# Patient Record
Sex: Male | Born: 1960
Health system: Southern US, Community
[De-identification: ages and names within clinical notes are randomized; demographics above are authoritative.]

---

## 2017-03-07 ENCOUNTER — Emergency Department (HOSPITAL_COMMUNITY)
Admission: EM | Admit: 2017-03-07 | Discharge: 2017-03-07 | Disposition: A | Discharge status: Home / Self Care | Payer: BLUE CROSS/BLUE SHIELD | Attending: Emergency Medicine | Admitting: Emergency Medicine

## 2017-03-07 ENCOUNTER — Emergency Department (HOSPITAL_COMMUNITY): Payer: BLUE CROSS/BLUE SHIELD

## 2017-03-07 DIAGNOSIS — R29810 Facial weakness: Secondary | ICD-10-CM | POA: Diagnosis present

## 2017-03-07 DIAGNOSIS — G510 Bell's palsy: Secondary | ICD-10-CM | POA: Diagnosis not present

## 2017-03-07 LAB — CBC WITH DIFFERENTIAL/PLATELET
BASOS ABS: 0 10*3/uL (ref 0.0–0.1)
BASOS PCT: 0 %
Eosinophils Absolute: 0.1 10*3/uL (ref 0.0–0.7)
Eosinophils Relative: 2 %
HEMATOCRIT: 42.6 % (ref 39.0–52.0)
HEMOGLOBIN: 14.7 g/dL (ref 13.0–17.0)
LYMPHS PCT: 36 %
Lymphs Abs: 2.2 10*3/uL (ref 0.7–4.0)
MCH: 30.7 pg (ref 26.0–34.0)
MCHC: 34.5 g/dL (ref 30.0–36.0)
MCV: 88.9 fL (ref 78.0–100.0)
MONOS PCT: 11 %
Monocytes Absolute: 0.6 10*3/uL (ref 0.1–1.0)
Neutro Abs: 3.2 10*3/uL (ref 1.7–7.7)
Neutrophils Relative %: 51 %
Platelets: 241 10*3/uL (ref 150–400)
RBC: 4.79 MIL/uL (ref 4.22–5.81)
RDW: 12.5 % (ref 11.5–15.5)
WBC: 6.1 10*3/uL (ref 4.0–10.5)

## 2017-03-07 LAB — URINALYSIS, ROUTINE W REFLEX MICROSCOPIC
Bilirubin Urine: NEGATIVE
Glucose, UA: NEGATIVE mg/dL
Hgb urine dipstick: NEGATIVE
Ketones, ur: NEGATIVE mg/dL
LEUKOCYTES UA: NEGATIVE
NITRITE: NEGATIVE
PH: 7 (ref 5.0–8.0)
Protein, ur: NEGATIVE mg/dL
SPECIFIC GRAVITY, URINE: 1.017 (ref 1.005–1.030)

## 2017-03-07 LAB — COMPREHENSIVE METABOLIC PANEL
ALBUMIN: 4.1 g/dL (ref 3.5–5.0)
ALK PHOS: 42 U/L (ref 38–126)
ALT: 41 U/L (ref 17–63)
AST: 27 U/L (ref 15–41)
Anion gap: 10 (ref 5–15)
BUN: 13 mg/dL (ref 6–20)
CHLORIDE: 103 mmol/L (ref 101–111)
CO2: 24 mmol/L (ref 22–32)
CREATININE: 0.8 mg/dL (ref 0.61–1.24)
Calcium: 8.8 mg/dL — ABNORMAL LOW (ref 8.9–10.3)
GFR calc non Af Amer: >60 mL/min (ref >60)
GLUCOSE: 98 mg/dL (ref 65–99)
Potassium: 4 mmol/L (ref 3.5–5.1)
SODIUM: 137 mmol/L (ref 135–145)
Total Bilirubin: 1.3 mg/dL — ABNORMAL HIGH (ref 0.3–1.2)
Total Protein: 7.2 g/dL (ref 6.5–8.1)

## 2017-03-07 LAB — ETHANOL: Alcohol, Ethyl (B): <5 mg/dL (ref <5)

## 2017-03-07 LAB — RAPID URINE DRUG SCREEN, HOSP PERFORMED
AMPHETAMINES: NOT DETECTED
BARBITURATES: NOT DETECTED
BENZODIAZEPINES: NOT DETECTED
COCAINE: NOT DETECTED
Opiates: NOT DETECTED
TETRAHYDROCANNABINOL: NOT DETECTED

## 2017-03-07 LAB — SALICYLATE LEVEL: Salicylate Lvl: <7 mg/dL (ref 2.8–30.0)

## 2017-03-07 MED ORDER — PREDNISONE 10 MG (21) PO TBPK: ORAL_TABLET | ORAL | 0 refills | Status: DC

## 2017-03-07 MED ORDER — ARTIFICIAL TEARS OPHTHALMIC OINT: TOPICAL_OINTMENT | OPHTHALMIC | 1 refills | Status: DC | PRN

## 2017-03-07 MED ORDER — DEXAMETHASONE SODIUM PHOSPHATE 10 MG/ML IJ SOLN
10.0000 mg | Freq: Once | INTRAMUSCULAR | Status: AC
  Administered 2017-03-07: 10 mg via INTRAMUSCULAR

## 2017-03-07 MED ORDER — DEXAMETHASONE SODIUM PHOSPHATE 10 MG/ML IJ SOLN
10.0000 mg | Freq: Once | INTRAMUSCULAR | Status: DC
  Filled 2017-03-07: qty 1

## 2017-03-07 NOTE — ED Notes (Signed)
ED Provider at bedside. 

## 2017-03-07 NOTE — ED Notes (Signed)
Patient transported to CT 

## 2017-03-07 NOTE — ED Notes (Signed)
Pt reports he will attempt to urinate in a few minutes. Explained to pt that the sooner we receive his urine specimen the faster it can be processed.

## 2017-03-07 NOTE — ED Provider Notes (Signed)
[facility]-EMERGENCY DEPT Provider Note   CSN: [id number] Arrival date & time: 03/07/17  1105     History   Chief Complaint Chief Complaint  Patient presents with  . Facial Droop    HPI Brett Smith is a 56 y.o. male.  HPI   Brett Smith is a 56 y.o. male, patient with no pertinent past medical history, presenting to the ED with right sided facial weakness beginning 4 days ago. Symptoms began the morning of July 18 with taste dysfunction on the right side of the tongue. He then began to have right sided facial weakness with an in ability to fully close the right eye or raise his right eyebrow. He then endorses right eye irritation beginning two days ago. He states water will flow out of the right side of his mouth. He denies vision dysfunction, swallow dysfunction, dizziness, headache, recent illness/trauma, facial numbness, extremity numbness or weakness, or any other complaints.     No past medical history on file.  There are no active problems to display for this patient.   No past surgical history on file.     Home Medications    Prior to Admission medications   Medication Sig Start Date End Date Taking? Authorizing Provider  artificial tears (LACRILUBE) OINT ophthalmic ointment Place into the right eye every 4 (four) hours as needed for dry eyes. 03/07/17   Tavi Hoogendoorn C, PA-C  predniSONE (STERAPRED UNI-PAK 21 TAB) 10 MG (21) TBPK tablet Take 6 tabs by mouth daily  for 2 days, then 5 tabs for 2 days, then 4 tabs for 2 days, then 3 tabs for 2 days, 2 tabs for 2 days, then 1 tab by mouth daily for 2 days 03/07/17   Anselm PancoastJoy, Tansy Lorek C, PA-C    Family History No family history on file.  Social History Social History  Substance Use Topics  . Smoking status: Not on file  . Smokeless tobacco: Not on file  . Alcohol use Not on file     Allergies   Patient has no known allergies.   Review of Systems Review of Systems  Constitutional: Negative for chills, diaphoresis and fever.    HENT: Negative for facial swelling and trouble swallowing.   Eyes: Negative for visual disturbance.  Respiratory: Negative for shortness of breath.   Cardiovascular: Negative for chest pain.  Gastrointestinal: Negative for nausea and vomiting.  Musculoskeletal: Negative for back pain and neck pain.  Neurological: Positive for facial asymmetry. Negative for dizziness, syncope, weakness, light-headedness, numbness and headaches.  All other systems reviewed and are negative.    Physical Exam Updated Vital Signs BP (!) 168/94   Pulse 85   Temp 98.2 F (36.8 C) (Oral)   Resp 18   Ht 5\' 4"  (1.626 m)   Wt 82.6 kg (182 lb)   SpO2 97%   BMI 31.24 kg/m   Physical Exam  Constitutional: He is oriented to person, place, and time. He appears well-developed and well-nourished. No distress.  HENT:  Head: Normocephalic and atraumatic.  Right Ear: Tympanic membrane, external ear and ear canal normal.  Left Ear: Tympanic membrane, external ear and ear canal normal.  No swelling or lesions to the bilateral ears or canals.  Eyes: Pupils are equal, round, and reactive to light. Conjunctivae and EOM are normal.  No scleral or conjunctival injection. No pain with EOMs or pupillary function. Right eyelid ptosis noted.  Neck: Normal range of motion. Neck supple.  Cardiovascular: Normal rate, regular rhythm, normal heart sounds  and intact distal pulses.   Pulmonary/Chest: Effort normal and breath sounds normal. No respiratory distress.  Abdominal: Soft. There is no tenderness. There is no guarding.  Musculoskeletal: He exhibits no edema.  Lymphadenopathy:    He has no cervical adenopathy.  Neurological: He is alert and oriented to person, place, and time.  Unable to fully close right eye. Unable to raise right eye brow. No movement on the right forehead when furrowing brow. Facial droop noted on right. No noted facial sensory dysfunction.  Uvula midline. Seems to handle oral secretions without  difficulty. Speech abnormality minimal. No tongue deviation.   No sensory deficits. Strength 5/5 in all extremities. No gait disturbance. Coordination intact including heel to shin and finger to nose. Cranial nerves III-XII otherwise grossly intact.  Skin: Skin is warm and dry. He is not diaphoretic.  Psychiatric: He has a normal mood and affect. His behavior is normal.  Nursing note and vitals reviewed.    ED Treatments / Results  Labs (all labs ordered are listed, but only abnormal results are displayed) Labs Reviewed  COMPREHENSIVE METABOLIC PANEL - Abnormal; Notable for the following:       Result Value   Calcium 8.8 (*)    Total Bilirubin 1.3 (*)    All other components within normal limits  URINALYSIS, ROUTINE W REFLEX MICROSCOPIC - Abnormal; Notable for the following:    APPearance CLOUDY (*)    All other components within normal limits  CBC WITH DIFFERENTIAL/PLATELET  RAPID URINE DRUG SCREEN, HOSP PERFORMED  ETHANOL  SALICYLATE LEVEL    EKG  EKG Interpretation None       Radiology Ct Head Wo Contrast  Result Date: 03/07/2017 CLINICAL DATA:  Right-sided facial numbness and weakness. EXAM: CT HEAD WITHOUT CONTRAST TECHNIQUE: Contiguous axial images were obtained from the base of the skull through the vertex without intravenous contrast. COMPARISON:  None. FINDINGS: Brain: There is no evidence of acute infarct, intracranial hemorrhage, mass, midline shift, or extra-axial fluid collection. The ventricles and sulci are normal. A small calcification is noted in cortex/gray-white junction of the parasagittal left frontal lobe, possibly the sequelae of prior infection. Vascular: Minimal bilateral carotid siphon calcified atherosclerosis. No hyperdense vessel. Skull: No fracture or suspicious osseous lesion. Poor dentition with multiple dental caries and periapical lucencies. Sinuses/Orbits: Mild left maxillary sinus mucosal thickening. Clear mastoid air cells. Unremarkable  orbits. Other: None. IMPRESSION: No evidence of acute intracranial abnormality. Electronically Signed   By: Sebastian Ache M.D.   On: 03/07/2017 13:37    Procedures Procedures (including critical care time)  Medications Ordered in ED Medications  dexamethasone (DECADRON) injection 10 mg (not administered)     Initial Impression / Assessment and Plan / ED Course  I have reviewed the triage vital signs and the nursing notes.  Pertinent labs & imaging results that were available during my care of the patient were reviewed by me and considered in my medical decision making (see chart for details).     Patient presents with symptoms consistent with Bell's palsy. Suspicion for stroke is low. Steroid therapy initiated. Neurology and ophthalmology follow-up. The patient was given instructions for home care as well as return precautions. Patient voices understanding of these instructions, accepts the plan, and is comfortable with discharge.   Findings and plan of care discussed with Margarita Grizzle, MD.    Vitals:   03/07/17 1114 03/07/17 1130 03/07/17 1200  BP: (!) 166/99 (!) 168/94 139/72  Pulse: 66 85 63  Resp: 18  Temp: 98.2 F (36.8 C)    TempSrc: Oral    SpO2: 100% 97% 98%  Weight: 82.6 kg (182 lb)    Height: 5\' 4"  (1.626 m)     Vitals:   03/07/17 1213 03/07/17 1224 03/07/17 1230 03/07/17 1300  BP: 135/82  130/83 (!) 142/83  Pulse: 60  (!) 58 (!) 54  Resp:      Temp:  98.7 F (37.1 C)    TempSrc:      SpO2: 97%  96% 97%  Weight:      Height:          Final Clinical Impressions(s) / ED Diagnoses   Final diagnoses:  Bell's palsy    New Prescriptions New Prescriptions   ARTIFICIAL TEARS (LACRILUBE) OINT OPHTHALMIC OINTMENT    Place into the right eye every 4 (four) hours as needed for dry eyes.   PREDNISONE (STERAPRED UNI-PAK 21 TAB) 10 MG (21) TBPK TABLET    Take 6 tabs by mouth daily  for 2 days, then 5 tabs for 2 days, then 4 tabs for 2 days, then 3 tabs for 2  days, 2 tabs for 2 days, then 1 tab by mouth daily for 2 days     Concepcion LivingJoy, Leightyn Cina C, PA-C 03/07/17 1404    Margarita Grizzleay, Danielle, MD 03/07/17 1640

## 2017-03-07 NOTE — ED Triage Notes (Addendum)
Pt reports Friday morning he began having issues with his right eye, and his right cheek feels numb. Pt also reports trouble moving his right side of his mouth, right sided facial droop. PT unable to raise his right eyebrow. No other deficits to the rest of body, no other nuerological symptoms.  Pt also states since Wednesday he has had trouble tasting food.

## 2017-03-07 NOTE — ED Notes (Signed)
Pt ambulatory to restroom with steady gait.

## 2017-03-07 NOTE — ED Notes (Signed)
Explained to pt need for urine specimen. Pt verbalized understanding but reports he does not need to void right now. Will continue to round on pt.

## 2017-03-07 NOTE — Discharge Instructions (Addendum)
Your symptoms today are consistent with a condition called Bell's palsy. Please see the attached information sheet. You were given a long-acting steroid here in the ED. Begin taking the prednisone in 48 hours, as prescribed, until finished. The artificial tear ointment is used for dry eyes. May also use artificial tears rewetting drops during the day if these are more convenient. These are available over-the-counter. Follow-up with neurology and ophthalmology. Call to make these appointments.  Return to the ED for any worsening symptoms.

## 2017-12-28 IMAGING — CT CT HEAD W/O CM
4 series · 16 of 47 positions shown, 18 images · non-contrast
Comparison: None.

CLINICAL DATA: Right-sided facial numbness and weakness.

EXAM:
CT HEAD WITHOUT CONTRAST
TECHNIQUE: Contiguous axial images were obtained from the base of the skull
through the vertex without intravenous contrast.

[Series 3: head wo · axial · 0.48mm/px · z∈[-184,-58]mm · 7 of 35 slices shown, 9 images]
[im 5/35  brain]
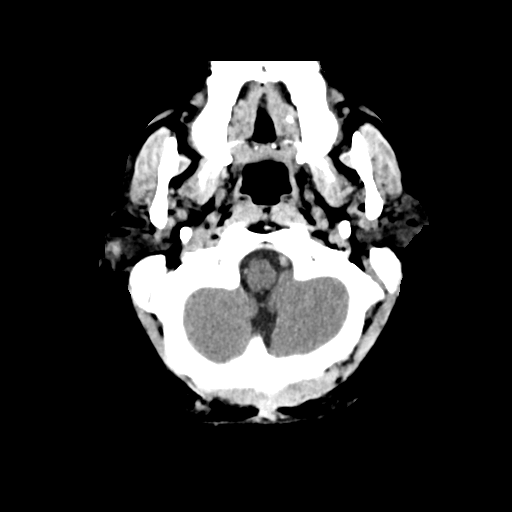
[im 5/35  bone]
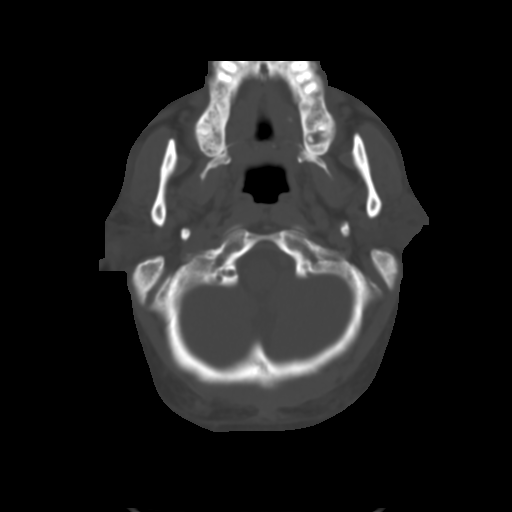
[im 9/35  brain]
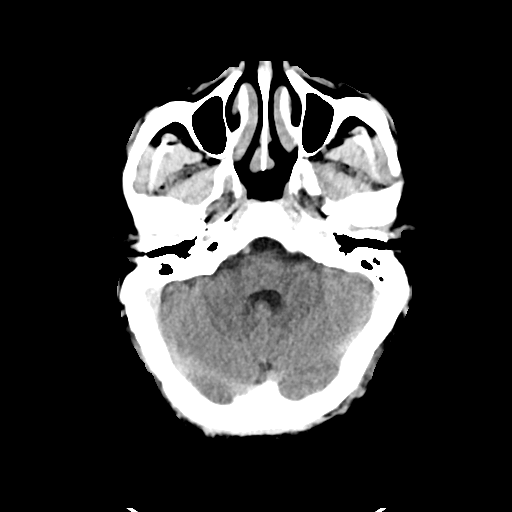
[im 13/35  brain]
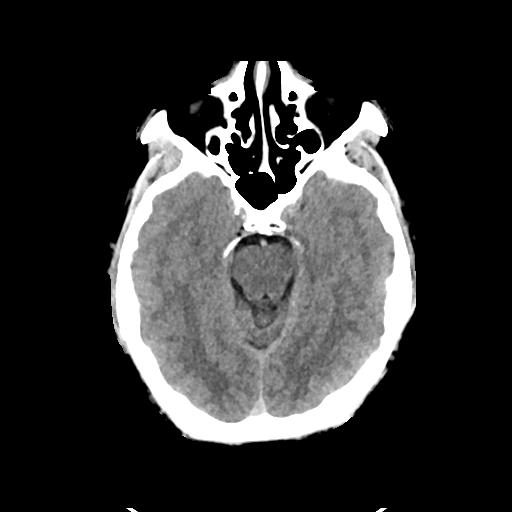
[im 18/35  brain]
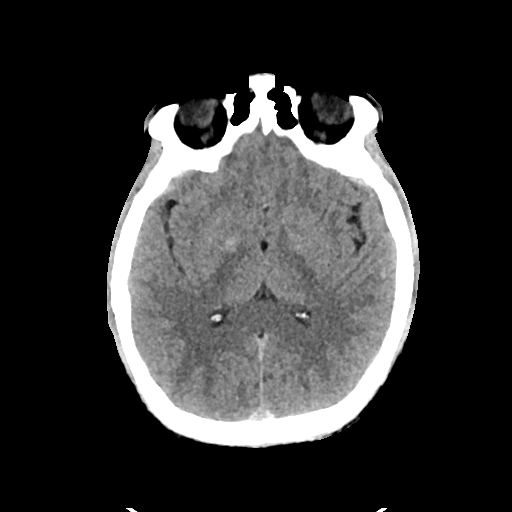
[im 22/35  brain]
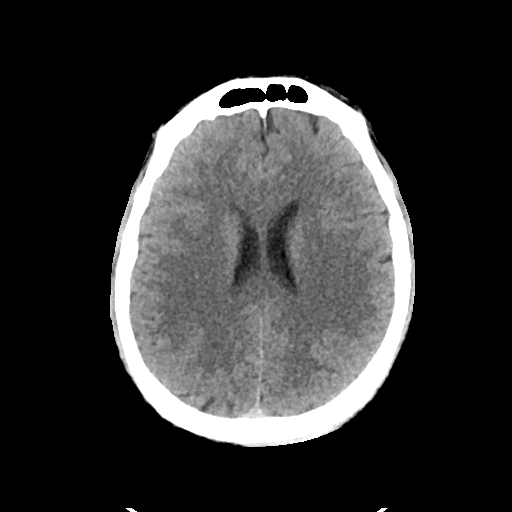
[im 22/35  bone]
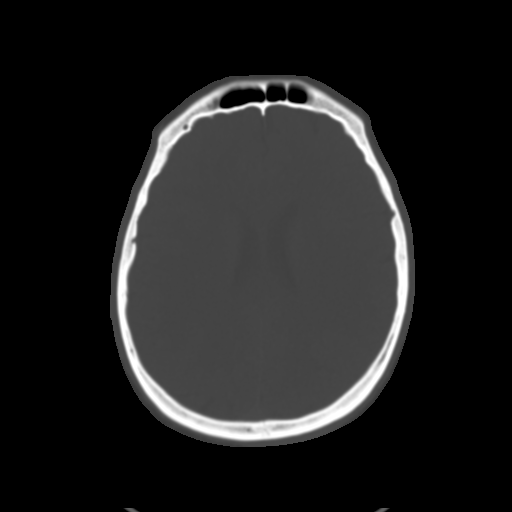
[im 26/35  brain]
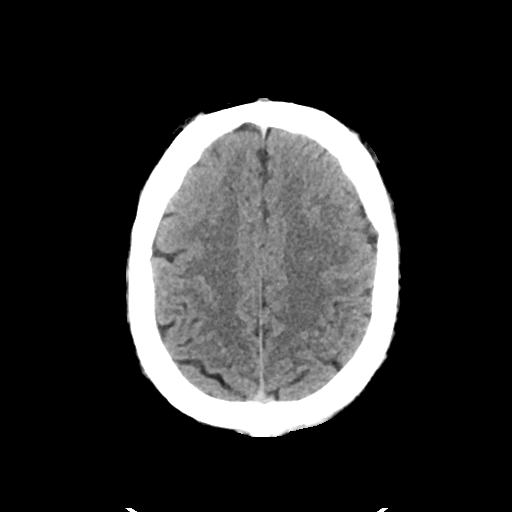
[im 30/35  brain]
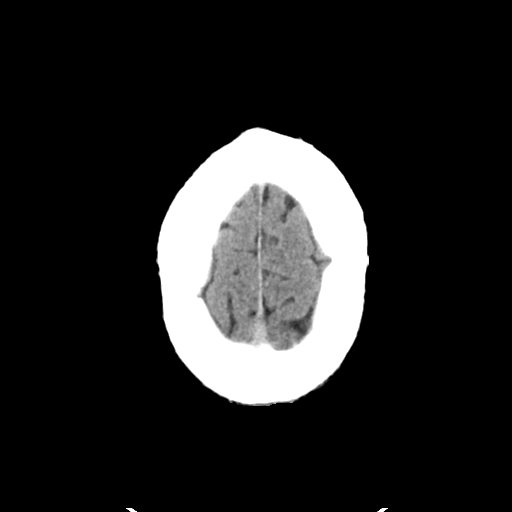

[Series 4: head bone · axial · 0.48mm/px · z∈[-188,-154]mm · 3 of 87 slices shown]
[im 9/87  bone]
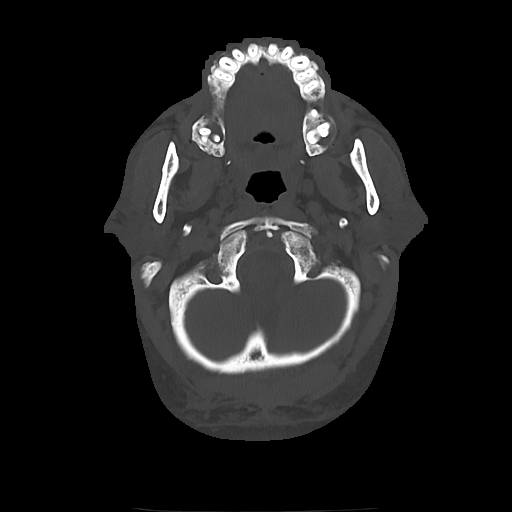
[im 18/87  bone]
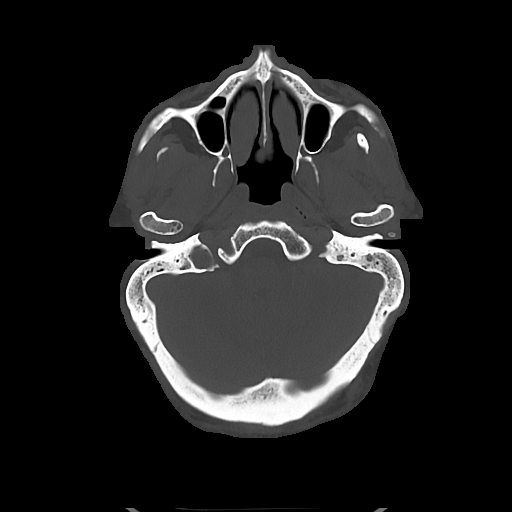
[im 26/87  bone]
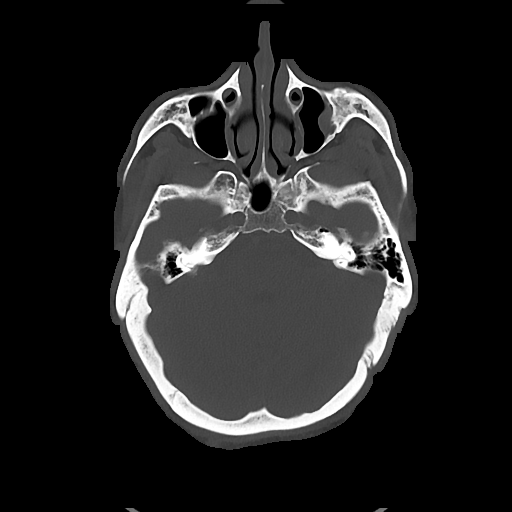

[Series 5: cor soft · coronal · 0.40mm/px · 3 of 83 slices shown]
[im 28/83  brain]
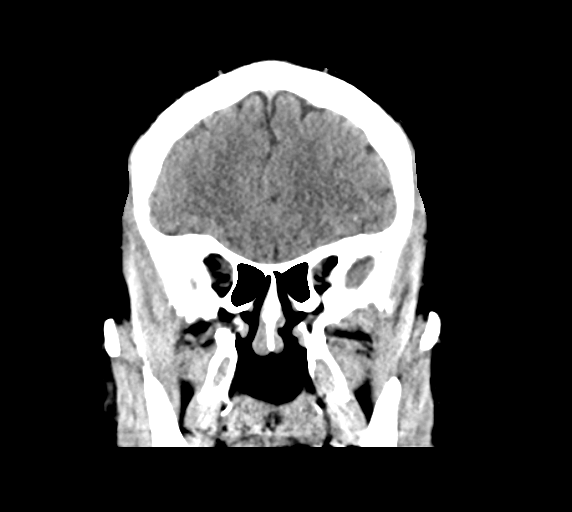
[im 37/83  brain]
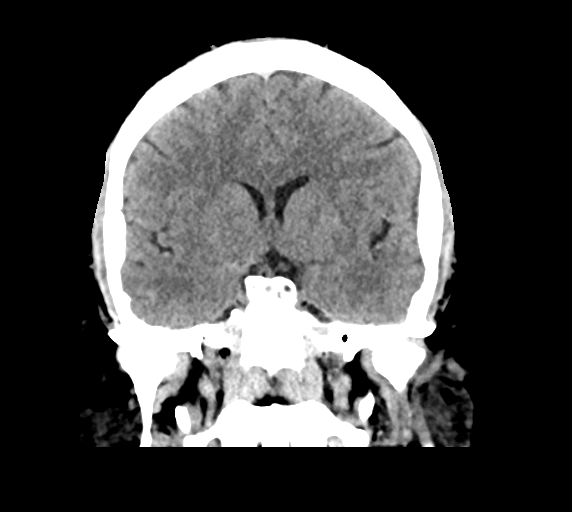
[im 46/83  brain]
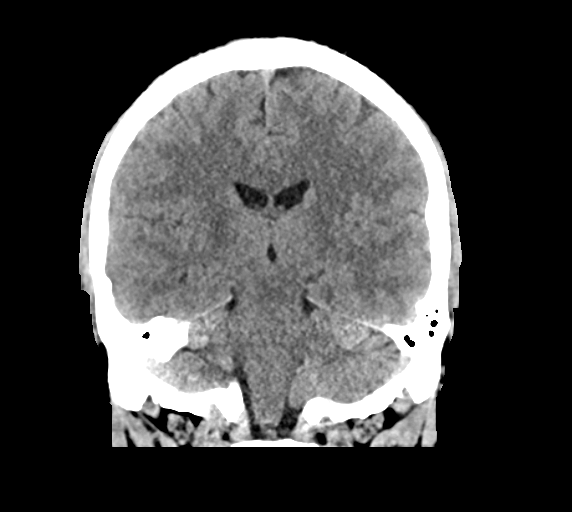

[Series 6: sag soft · sagittal · 0.38mm/px · 3 of 67 slices shown]
[im 23/67  brain]
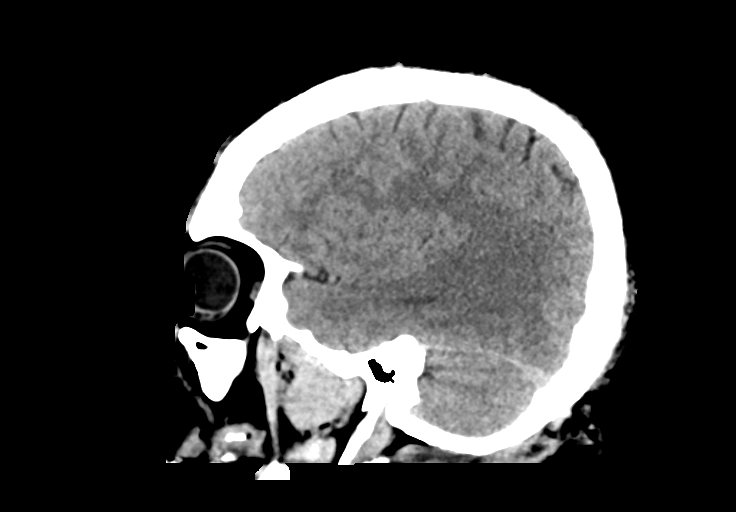
[im 34/67  brain]
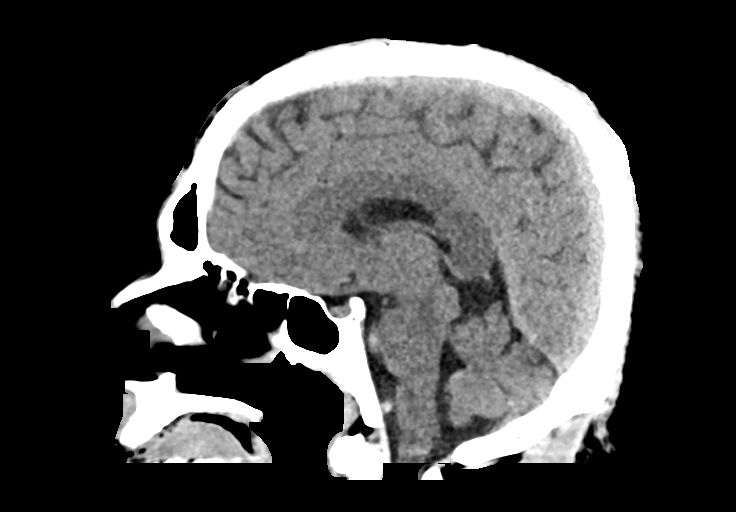
[im 45/67  brain]
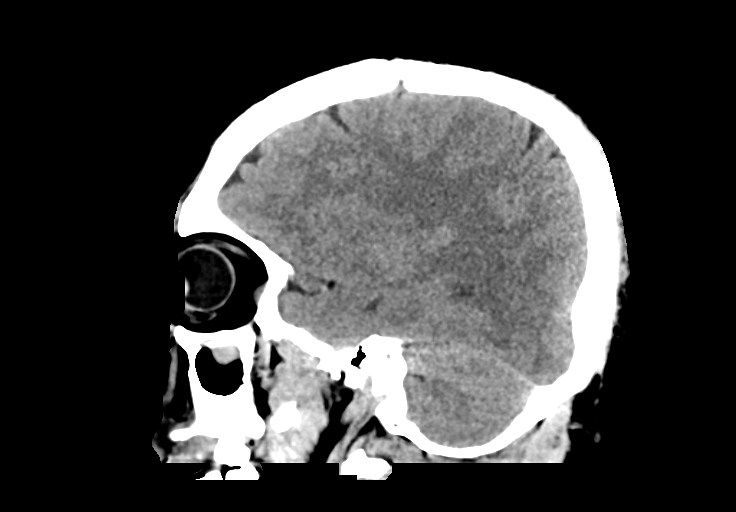

[16 of 47 positions shown; findings below may reference images not displayed]

FINDINGS: Brain: There is no evidence of acute infarct, intracranial
hemorrhage, mass, midline shift, or extra-axial fluid collection.
The ventricles and sulci are normal. A small calcification is noted
in cortex/gray-white junction of the parasagittal left frontal lobe,
possibly the sequelae of prior infection.

Vascular: Minimal bilateral carotid siphon calcified
atherosclerosis. No hyperdense vessel.

Skull: No fracture or suspicious osseous lesion. Poor dentition with
multiple dental caries and periapical lucencies.

Sinuses/Orbits: Mild left maxillary sinus mucosal thickening. Clear
mastoid air cells. Unremarkable orbits.

Other: None.
IMPRESSION: No evidence of acute intracranial abnormality.

## 2020-04-18 ENCOUNTER — Encounter (HOSPITAL_COMMUNITY): Payer: Self-pay | Admitting: Emergency Medicine

## 2020-04-18 ENCOUNTER — Ambulatory Visit (HOSPITAL_COMMUNITY)
Admission: EM | Admit: 2020-04-18 | Discharge: 2020-04-18 | Disposition: A | Discharge status: Home / Self Care | Payer: BC Managed Care – PPO

## 2020-04-18 ENCOUNTER — Other Ambulatory Visit: Payer: Self-pay

## 2020-04-18 ENCOUNTER — Inpatient Hospital Stay (HOSPITAL_COMMUNITY)
Admission: EM | Admit: 2020-04-18 | Discharge: 2020-04-26 | DRG: 177 | Disposition: A | Discharge status: Home / Self Care | Payer: BC Managed Care – PPO | Attending: Internal Medicine | Admitting: Internal Medicine

## 2020-04-18 ENCOUNTER — Emergency Department (HOSPITAL_COMMUNITY): Payer: BC Managed Care – PPO

## 2020-04-18 DIAGNOSIS — U071 COVID-19: Secondary | ICD-10-CM

## 2020-04-18 DIAGNOSIS — R739 Hyperglycemia, unspecified: Secondary | ICD-10-CM | POA: Diagnosis not present

## 2020-04-18 DIAGNOSIS — J189 Pneumonia, unspecified organism: Secondary | ICD-10-CM | POA: Diagnosis not present

## 2020-04-18 DIAGNOSIS — Z79899 Other long term (current) drug therapy: Secondary | ICD-10-CM

## 2020-04-18 DIAGNOSIS — J1282 Pneumonia due to coronavirus disease 2019: Secondary | ICD-10-CM | POA: Diagnosis present

## 2020-04-18 DIAGNOSIS — L270 Generalized skin eruption due to drugs and medicaments taken internally: Secondary | ICD-10-CM | POA: Diagnosis not present

## 2020-04-18 DIAGNOSIS — J9601 Acute respiratory failure with hypoxia: Secondary | ICD-10-CM | POA: Diagnosis present

## 2020-04-18 DIAGNOSIS — D72825 Bandemia: Secondary | ICD-10-CM | POA: Diagnosis not present

## 2020-04-18 DIAGNOSIS — T375X5A Adverse effect of antiviral drugs, initial encounter: Secondary | ICD-10-CM | POA: Diagnosis not present

## 2020-04-18 DIAGNOSIS — D72829 Elevated white blood cell count, unspecified: Secondary | ICD-10-CM

## 2020-04-18 DIAGNOSIS — Z823 Family history of stroke: Secondary | ICD-10-CM | POA: Diagnosis not present

## 2020-04-18 DIAGNOSIS — R7303 Prediabetes: Secondary | ICD-10-CM | POA: Diagnosis not present

## 2020-04-18 DIAGNOSIS — R0602 Shortness of breath: Secondary | ICD-10-CM | POA: Diagnosis not present

## 2020-04-18 DIAGNOSIS — T380X5A Adverse effect of glucocorticoids and synthetic analogues, initial encounter: Secondary | ICD-10-CM | POA: Diagnosis present

## 2020-04-18 LAB — CBC WITH DIFFERENTIAL/PLATELET
Abs Immature Granulocytes: 0.1 10*3/uL — ABNORMAL HIGH (ref 0.00–0.07)
Basophils Absolute: 0 10*3/uL (ref 0.0–0.1)
Basophils Relative: 0 %
Eosinophils Absolute: 0 10*3/uL (ref 0.0–0.5)
Eosinophils Relative: 0 %
HCT: 45.1 % (ref 39.0–52.0)
Hemoglobin: 14.8 g/dL (ref 13.0–17.0)
Immature Granulocytes: 1 %
Lymphocytes Relative: 9 %
Lymphs Abs: 0.9 10*3/uL (ref 0.7–4.0)
MCH: 30 pg (ref 26.0–34.0)
MCHC: 32.8 g/dL (ref 30.0–36.0)
MCV: 91.5 fL (ref 80.0–100.0)
Monocytes Absolute: 0.5 10*3/uL (ref 0.1–1.0)
Monocytes Relative: 5 %
Neutro Abs: 8.8 10*3/uL — ABNORMAL HIGH (ref 1.7–7.7)
Neutrophils Relative %: 85 %
Platelets: 261 10*3/uL (ref 150–400)
RBC: 4.93 MIL/uL (ref 4.22–5.81)
RDW: 13.1 % (ref 11.5–15.5)
WBC: 10.4 10*3/uL (ref 4.0–10.5)
nRBC: 0 % (ref 0.0–0.2)

## 2020-04-18 LAB — COMPREHENSIVE METABOLIC PANEL
ALT: 132 U/L — ABNORMAL HIGH (ref 0–44)
AST: 79 U/L — ABNORMAL HIGH (ref 15–41)
Albumin: 3 g/dL — ABNORMAL LOW (ref 3.5–5.0)
Alkaline Phosphatase: 42 U/L (ref 38–126)
Anion gap: 15 (ref 5–15)
BUN: 16 mg/dL (ref 6–20)
CO2: 24 mmol/L (ref 22–32)
Calcium: 8.7 mg/dL — ABNORMAL LOW (ref 8.9–10.3)
Chloride: 95 mmol/L — ABNORMAL LOW (ref 98–111)
Creatinine, Ser: 0.8 mg/dL (ref 0.61–1.24)
GFR calc Af Amer: >60 mL/min (ref >60)
GFR calc non Af Amer: >60 mL/min (ref >60)
Glucose, Bld: 160 mg/dL — ABNORMAL HIGH (ref 70–99)
Potassium: 4.5 mmol/L (ref 3.5–5.1)
Sodium: 134 mmol/L — ABNORMAL LOW (ref 135–145)
Total Bilirubin: 1 mg/dL (ref 0.3–1.2)
Total Protein: 7.7 g/dL (ref 6.5–8.1)

## 2020-04-18 LAB — LACTATE DEHYDROGENASE: LDH: 332 U/L — ABNORMAL HIGH (ref 98–192)

## 2020-04-18 LAB — PROCALCITONIN: Procalcitonin: <0.1 ng/mL

## 2020-04-18 LAB — TRIGLYCERIDES: Triglycerides: 147 mg/dL (ref <150)

## 2020-04-18 LAB — D-DIMER, QUANTITATIVE: D-Dimer, Quant: 0.59 ug/mL-FEU — ABNORMAL HIGH (ref 0.00–0.50)

## 2020-04-18 LAB — C-REACTIVE PROTEIN: CRP: 9.1 mg/dL — ABNORMAL HIGH (ref <1.0)

## 2020-04-18 LAB — TROPONIN I (HIGH SENSITIVITY)
Troponin I (High Sensitivity): 16 ng/L (ref <18)
Troponin I (High Sensitivity): 18 ng/L — ABNORMAL HIGH (ref <18)

## 2020-04-18 LAB — FIBRINOGEN: Fibrinogen: >800 mg/dL — ABNORMAL HIGH (ref 210–475)

## 2020-04-18 LAB — LACTIC ACID, PLASMA
Lactic Acid, Venous: 2.6 mmol/L (ref 0.5–1.9)
Lactic Acid, Venous: 2.2 mmol/L (ref 0.5–1.9)

## 2020-04-18 LAB — FERRITIN: Ferritin: 2904 ng/mL — ABNORMAL HIGH (ref 24–336)

## 2020-04-18 MED ORDER — BARICITINIB 2 MG PO TABS
4.0000 mg | ORAL_TABLET | Freq: Every day | ORAL | Status: DC
  Administered 2020-04-18 – 2020-04-21 (×4): 4 mg via ORAL
  Filled 2020-04-18 (×4): qty 2

## 2020-04-18 MED ORDER — AZITHROMYCIN 250 MG PO TABS
500.0000 mg | ORAL_TABLET | Freq: Every day | ORAL | Status: AC
  Administered 2020-04-18: 500 mg via ORAL
  Filled 2020-04-18: qty 2

## 2020-04-18 MED ORDER — AZITHROMYCIN 250 MG PO TABS: 250.0000 mg | ORAL_TABLET | Freq: Every day | ORAL | Status: DC

## 2020-04-18 MED ORDER — ADULT MULTIVITAMIN W/MINERALS CH
1.0000 | ORAL_TABLET | Freq: Every day | ORAL | Status: DC
  Administered 2020-04-19 – 2020-04-26 (×8): 1 via ORAL
  Filled 2020-04-18 (×8): qty 1

## 2020-04-18 MED ORDER — IPRATROPIUM-ALBUTEROL 20-100 MCG/ACT IN AERS
1.0000 | INHALATION_SPRAY | Freq: Four times a day (QID) | RESPIRATORY_TRACT | Status: DC
  Filled 2020-04-18: qty 4

## 2020-04-18 MED ORDER — SODIUM CHLORIDE 0.9 % IV BOLUS
1000.0000 mL | Freq: Once | INTRAVENOUS | Status: AC
  Administered 2020-04-18: 1000 mL via INTRAVENOUS

## 2020-04-18 MED ORDER — ASCORBIC ACID 500 MG PO TABS
500.0000 mg | ORAL_TABLET | Freq: Every day | ORAL | Status: DC
  Administered 2020-04-19 – 2020-04-26 (×8): 500 mg via ORAL
  Filled 2020-04-18 (×8): qty 1

## 2020-04-18 MED ORDER — PREDNISONE 20 MG PO TABS: 50.0000 mg | ORAL_TABLET | Freq: Every day | ORAL | Status: DC

## 2020-04-18 MED ORDER — VITAMIN D 25 MCG (1000 UNIT) PO TABS
1000.0000 [IU] | ORAL_TABLET | Freq: Every day | ORAL | Status: DC
  Administered 2020-04-19 – 2020-04-26 (×8): 1000 [IU] via ORAL
  Filled 2020-04-18 (×8): qty 1

## 2020-04-18 MED ORDER — ENOXAPARIN SODIUM 40 MG/0.4ML ~~LOC~~ SOLN
40.0000 mg | Freq: Every day | SUBCUTANEOUS | Status: DC
  Administered 2020-04-19 – 2020-04-26 (×8): 40 mg via SUBCUTANEOUS
  Filled 2020-04-18 (×8): qty 0.4

## 2020-04-18 MED ORDER — ZINC SULFATE 220 (50 ZN) MG PO CAPS
220.0000 mg | ORAL_CAPSULE | Freq: Every day | ORAL | Status: DC
  Administered 2020-04-19 – 2020-04-26 (×8): 220 mg via ORAL
  Filled 2020-04-18 (×8): qty 1

## 2020-04-18 MED ORDER — METHYLPREDNISOLONE SODIUM SUCC 40 MG IJ SOLR
40.0000 mg | Freq: Two times a day (BID) | INTRAMUSCULAR | Status: DC
  Administered 2020-04-18: 40 mg via INTRAVENOUS
  Filled 2020-04-18: qty 1

## 2020-04-18 NOTE — ED Provider Notes (Signed)
[facility] EMERGENCY DEPARTMENT Provider Note   CSN: [id number] Arrival date & time: 04/18/20  1945     History Chief Complaint  Patient presents with  . Cough    Brett Smith is a 59 y.o. male.  HPI  Patient presents with ongoing dyspnea, fatigue. Patient was diagnosed with coronavirus April 10, 2020, after being ill several days prior to that. He notes that he has completed medication provided by urgent care, but now over the past 2 days in particular he has had increasing dyspnea. He has a home oximetry device, and found his levels below 90% over the last day. No new chest pain, no new syncope.  The patient states that he is generally well, does not smoke, does not drink, works in [employer].   History reviewed. No pertinent past medical history.  There are no problems to display for this patient.   History reviewed. No pertinent surgical history.     No family history on file.  Social History   Tobacco Use  . Smoking status: Not on file  Substance Use Topics  . Alcohol use: Not on file  . Drug use: Not on file    Home Medications Prior to Admission medications   Medication Sig Start Date End Date Taking? Authorizing Provider  artificial tears (LACRILUBE) OINT ophthalmic ointment Place into the right eye every 4 (four) hours as needed for dry eyes. 03/07/17   Joy, Shawn C, PA-C  predniSONE (STERAPRED UNI-PAK 21 TAB) 10 MG (21) TBPK tablet Take 6 tabs by mouth daily  for 2 days, then 5 tabs for 2 days, then 4 tabs for 2 days, then 3 tabs for 2 days, 2 tabs for 2 days, then 1 tab by mouth daily for 2 days 03/07/17   Harolyn Rutherford C, PA-C    Allergies    Patient has no known allergies.  Review of Systems   Review of Systems  Constitutional:       Per HPI, otherwise negative  HENT:       Per HPI, otherwise negative  Respiratory:       Per HPI, otherwise negative  Cardiovascular:       Per HPI, otherwise negative  Gastrointestinal:  Negative for vomiting.  Endocrine:       Negative aside from HPI  Genitourinary:       Neg aside from HPI   Musculoskeletal:       Per HPI, otherwise negative  Skin: Negative.   Allergic/Immunologic: Negative for immunocompromised state.  Neurological: Negative for syncope.    Physical Exam Updated Vital Signs BP (!) 120/104 (BP Location: Right Arm)   Pulse 99   Temp 98.7 F (37.1 C) (Oral)   Resp 20   SpO2 (!) 86% Comment: informed nurse  Physical Exam Vitals and nursing note reviewed.  Constitutional:      General: He is not in acute distress.    Appearance: He is well-developed.  HENT:     Head: Normocephalic and atraumatic.  Eyes:     Conjunctiva/sclera: Conjunctivae normal.  Cardiovascular:     Rate and Rhythm: Regular rhythm. Tachycardia present.  Pulmonary:     Effort: Pulmonary effort is normal. Tachypnea present.     Breath sounds: Decreased air movement present.  Abdominal:     General: There is no distension.  Skin:    General: Skin is warm and dry.  Neurological:     Mental Status: He is alert and oriented to person, place, and time.  ED Results / Procedures / Treatments   Labs (all labs ordered are listed, but only abnormal results are displayed) Labs Reviewed  LACTIC ACID, PLASMA - Abnormal; Notable for the following components:      Result Value   Lactic Acid, Venous 2.6 (*)    All other components within normal limits  LACTIC ACID, PLASMA - Abnormal; Notable for the following components:   Lactic Acid, Venous 2.2 (*)    All other components within normal limits  CBC WITH DIFFERENTIAL/PLATELET - Abnormal; Notable for the following components:   Neutro Abs 8.8 (*)    Abs Immature Granulocytes 0.10 (*)    All other components within normal limits  COMPREHENSIVE METABOLIC PANEL - Abnormal; Notable for the following components:   Sodium 134 (*)    Chloride 95 (*)    Glucose, Bld 160 (*)    Calcium 8.7 (*)    Albumin 3.0 (*)    AST 79 (*)     ALT 132 (*)    All other components within normal limits  D-DIMER, QUANTITATIVE (NOT AT [facility]) - Abnormal; Notable for the following components:   D-Dimer, Quant 0.59 (*)    All other components within normal limits  LACTATE DEHYDROGENASE - Abnormal; Notable for the following components:   LDH 332 (*)    All other components within normal limits  FERRITIN - Abnormal; Notable for the following components:   Ferritin 2,904 (*)    All other components within normal limits  FIBRINOGEN - Abnormal; Notable for the following components:   Fibrinogen >800 (*)    All other components within normal limits  C-REACTIVE PROTEIN - Abnormal; Notable for the following components:   CRP 9.1 (*)    All other components within normal limits  TROPONIN I (HIGH SENSITIVITY) - Abnormal; Notable for the following components:   Troponin I (High Sensitivity) 18 (*)    All other components within normal limits  CULTURE, BLOOD (ROUTINE X 2)  CULTURE, BLOOD (ROUTINE X 2)  SARS CORONAVIRUS 2 BY RT PCR (HOSPITAL ORDER, PERFORMED IN [place] [facility])  PROCALCITONIN  TRIGLYCERIDES  TROPONIN I (HIGH SENSITIVITY)      EKG None  Radiology DG Chest Port 1 View  Result Date: 04/18/2020 CLINICAL DATA:  COVID-19 positive 12 days ago, cough, short of breath EXAM: PORTABLE CHEST 1 VIEW COMPARISON:  None. FINDINGS: Single frontal view of the chest demonstrates an unremarkable cardiac silhouette. There is multifocal bilateral ground-glass airspace disease greatest in the lung bases. No effusion or pneumothorax. No acute bony abnormalities. IMPRESSION: 1. Multifocal bilateral pneumonia compatible with COVID 19. Electronically Signed   By: Sharlet Salina M.D.   On: 04/18/2020 21:11    Procedures Procedures (including critical care time)   Room air saturation 85%, with 4 L nasal cannula and the patient is 97% saturation  Medications Ordered in ED Medications  sodium chloride 0.9 % bolus 1,000 mL (1,000 mLs  Intravenous New Bag/Given 04/18/20 2220)    ED Course  I have reviewed the triage vital signs and the nursing notes.  Pertinent labs & imaging results that were available during my care of the patient were reviewed by me and considered in my medical decision making (see chart for details).    MDM Rules/Calculators/A&P Adult male, previously well presents with ongoing Covid illness, now was found to have hypoxia, seemingly new, x-ray notable for bilateral opacifications concerning for pneumonia. Given his persistent/worsening symptoms condition, patient will require admission for further monitoring, management.  Final Clinical Impression(s) / ED Diagnoses Final diagnoses:  Pneumonia due to COVID-19 virus     Gerhard Munch, MD 04/18/20 2242

## 2020-04-18 NOTE — ED Triage Notes (Signed)
Pt reports he tested +covid x 12 days ago, c/o cough and occasional shortness of breath. States he has been checking his O2 levels and they have been in the 80s. SpO2 86% room air in triage. 2L [place] applied.

## 2020-04-18 NOTE — ED Notes (Signed)
Patient is being discharged from the Urgent Care and sent to the Emergency Department via ambulance . Per [facility], np, patient is in need of higher level of care due to hypoxia, tachypnea. Patient is aware and verbalizes understanding of plan of care.  Vitals:   04/18/20 1927 04/18/20 1933  BP: (!) 129/93   Pulse: 91   Resp: 19   Temp: 98.5 F (36.9 C)   SpO2: 91% 90%

## 2020-04-18 NOTE — ED Notes (Signed)
Date and time results received: 04/18/20 9:52 PM  (use smartphrase ".now" to insert current time)  Test: lactic acid Critical Value: 2.6  Name of Provider Notified: lockwood MD   Orders Received? Or Actions Taken?:

## 2020-04-18 NOTE — ED Notes (Addendum)
Per K. Harris, NP pt need additional care due to decreased O2 levels. EMS called to transport pt to ED.   Patient is being discharged from the Urgent Care and sent to the Emergency Department via EMS. Per Colin Mulders, NP, patient is in need of higher level of care due to decreased O2 levels.. Patient is aware and verbalizes understanding of plan of care. SPO2 83%. Places on 2 lpm via [place].

## 2020-04-18 NOTE — H&P (Addendum)
History and Physical  Brett Smith QAS:[id number] DOB: 10-02-1960 DOA: 04/18/2020  Referring physician: Dr. Jeraldine Loots  PCP: Patient, No Pcp Per  Outpatient Specialists: None Patient coming from: Home  Chief Complaint: Cough and shortness of breath.  HPI: Brett Smith is a 59 y.o. male with medical history significant for recent COVID-19 viral infection, tested positive on 04/10/2020 in the outpatient setting.  Presented with 2 days of worsening dyspnea and persistent nonproductive cough.  Associated with generalized weakness and fatigue.  Reports recently treated at urgent care for COVID-19 viral pneumonitis with prednisone which he took for 4 days and has 3 days remaining.  In the past 2 days has had increasing dyspnea and worsening cough.  5 days prior to testing positive on 04/10/2020 had diarrhea, vomiting, loss of taste and smell, these symptoms have now resolved.  He has a pulse oximeter at home and today his O2 saturation was below 80% at home.  Due to concern for his hypoxia, he presented to the ED for further evaluation and management of his symptomatology.  [facility] was asked to admit.  ED Course: Chest x-ray with bilateral pulmonary infiltrates suggestive of Covid 19 viral pneumonia with pneumonitis.  Lab studies remarkable for elevated inflammatory markers, LDH 332, ferritin greater than 2900, CRP 9.1, D-dimer 0.59, fibrinogen greater than 800.  Review of Systems: Review of systems as noted in the HPI. All other systems reviewed and are negative.   Past medical history: No prior medical history Past surgical history: No prior surgery  Social History: No use of alcohol, tobacco or illicit drugs.  No Known Allergies  Family History:  Mother deceased with stroke, older brother also deceased with stroke.   Prior to Admission medications   Medication Sig Start Date End Date Taking? Authorizing Provider  artificial tears (LACRILUBE) OINT ophthalmic ointment Place into the right eye every 4 (four)  hours as needed for dry eyes. 03/07/17   Joy, Shawn C, PA-C  predniSONE (STERAPRED UNI-PAK 21 TAB) 10 MG (21) TBPK tablet Take 6 tabs by mouth daily  for 2 days, then 5 tabs for 2 days, then 4 tabs for 2 days, then 3 tabs for 2 days, 2 tabs for 2 days, then 1 tab by mouth daily for 2 days 03/07/17   Anselm Pancoast, PA-C    Physical Exam: BP (!) 120/104 (BP Location: Right Arm)   Pulse 99   Temp 98.7 F (37.1 C) (Oral)   Resp 20   SpO2 (!) 86% Comment: informed nurse  . General: 59 y.o. year-old male well developed well nourished in no acute distress.  Alert and oriented x3. . Cardiovascular: Regular rate and rhythm with no rubs or gallops.  No thyromegaly or JVD noted.  No lower extremity edema. 2/4 pulses in all 4 extremities. Marland Kitchen Respiratory: Diffuse rales and wheezes bilaterally.  Good inspiratory effort.   . Abdomen: Soft nontender nondistended with normal bowel sounds x4 quadrants. . Muskuloskeletal: No cyanosis, clubbing or edema noted bilaterally . Neuro: CN II-XII intact, strength, sensation, reflexes . Skin: No ulcerative lesions noted or rashes . Psychiatry: Judgement and insight appear normal. Mood is appropriate for condition and setting          Labs on Admission:  Basic Metabolic Panel: Recent Labs  Lab 04/18/20 2025  NA 134*  K 4.5  CL 95*  CO2 24  GLUCOSE 160*  BUN 16  CREATININE 0.80  CALCIUM 8.7*   Liver Function Tests: Recent Labs  Lab 04/18/20 2025  AST  79*  ALT 132*  ALKPHOS 42  BILITOT 1.0  PROT 7.7  ALBUMIN 3.0*   No results for input(s): LIPASE, AMYLASE in the last 168 hours. No results for input(s): AMMONIA in the last 168 hours. CBC: Recent Labs  Lab 04/18/20 2025  WBC 10.4  NEUTROABS 8.8*  HGB 14.8  HCT 45.1  MCV 91.5  PLT 261   Cardiac Enzymes: No results for input(s): CKTOTAL, CKMB, CKMBINDEX, TROPONINI in the last 168 hours.  BNP (last 3 results) No results for input(s): BNP in the last 8760 hours.  ProBNP (last 3 results) No  results for input(s): PROBNP in the last 8760 hours.  CBG: No results for input(s): GLUCAP in the last 168 hours.  Radiological Exams on Admission: DG Chest Port 1 View  Result Date: 04/18/2020 CLINICAL DATA:  COVID-19 positive 12 days ago, cough, short of breath EXAM: PORTABLE CHEST 1 VIEW COMPARISON:  None. FINDINGS: Single frontal view of the chest demonstrates an unremarkable cardiac silhouette. There is multifocal bilateral ground-glass airspace disease greatest in the lung bases. No effusion or pneumothorax. No acute bony abnormalities. IMPRESSION: 1. Multifocal bilateral pneumonia compatible with COVID 19. Electronically Signed   By: Sharlet Salina M.D.   On: 04/18/2020 21:11    EKG: I independently viewed the EKG done and my findings are as followed: Normal sinus rhythm rate of 75.  Assessment/Plan Present on Admission: . Acute hypoxemic respiratory failure due to COVID-19 [facility])  Active Problems:   Acute hypoxemic respiratory failure due to COVID-19 [facility])  Acute hypoxic respiratory failure secondary to COVID-19 viral pneumonia with pneumonitis Not on oxygen supplementation at baseline Presented with dyspnea and hypoxia with O2 saturation in the 80s on room air, improved to the low 90s on 2 L. Maintain O2 saturation greater than 92% Give 2 doses of IV Lasix 20 mg twice daily. Start bronchodilators and pulmonary toilet Antitussives Start incentive spirometer, flutter valve Pronate as tolerated  COVID-19 viral pneumonia with pneumonitis Independently reviewed chest x-ray done on admission which showed bilateral pulmonary infiltrates, recent positive COVID-19 test 04/10/2020. Repeat COVID-19 screening test on 04/18/2020 Apply airborne precautions Start COVID-19 directed therapies Start IV Solu-Medrol Trend inflammatory markers Vitamin C, D3, zinc Rest of management per above  Elevated D-dimer likely in the setting of COVID-19 viral infection Trend markers DVT  prophylaxis  Hyperglycemia Presented with serum glucose 160 Not on oral hypoglycemics at home Obtain A1c Insulin sliding scale  Neutrophilia with concern for possible superimposed bacterial pneumonia We will start 5 days of azithromycin Currently afebrile with no leukocytosis  Monitor fever curve and WBC    DVT prophylaxis: Subcu Lovenox daily  Code Status: Full code  Family Communication: None at bedside  Disposition Plan: Admit to telemetry medical  Consults called: None  Admission status: Inpatient status   Status is: Inpatient    Dispo:  Patient From: Home  Planned Disposition: Home  Expected discharge date: 04/20/20  Medically stable for discharge: No, ongoing management of acute hypoxic respiratory failure secondary to COVID-19 viral pneumonia.        Darlin Drop MD [facility] Pager [phone]  If 7PM-7AM, please contact night-coverage [email or web address] Password [email or web address]  04/18/2020, 11:06 PM

## 2020-04-18 NOTE — ED Provider Notes (Signed)
[facility]    CSN: [id number] Arrival date & time: 04/18/20  1603      History   Chief Complaint Chief Complaint  Patient presents with  . Shortness of Breath    HPI Brett Smith is a 59 y.o. male.   HPI  Patient with a confirmed COVID-19 diagnosis as of 04/10/2020, presents today for shortness of breath , on arrival his pulse oximetry 85% steady, he has checked his level at home and initially hovering around low 90's however, yesterday dropped to 88-89%. He reports feeling as though he is drowning when lying down.Limited medical history available. Per patient he has not underlying chronic conditions. No known history of  lung diseases.    No past medical history on file.  There are no problems to display for this patient.        Home Medications    Prior to Admission medications   Medication Sig Start Date End Date Taking? Authorizing Provider  artificial tears (LACRILUBE) OINT ophthalmic ointment Place into the right eye every 4 (four) hours as needed for dry eyes. 03/07/17   Joy, Shawn C, PA-C  predniSONE (STERAPRED UNI-PAK 21 TAB) 10 MG (21) TBPK tablet Take 6 tabs by mouth daily  for 2 days, then 5 tabs for 2 days, then 4 tabs for 2 days, then 3 tabs for 2 days, 2 tabs for 2 days, then 1 tab by mouth daily for 2 days 03/07/17   Anselm Pancoast, PA-C    Family History No family history on file.  Social History Social History   Tobacco Use  . Smoking status: Not on file  Substance Use Topics  . Alcohol use: Not on file  . Drug use: Not on file     Allergies   Patient has no known allergies.   Review of Systems Review of Systems Pertinent negatives listed in HPI   Physical Exam Triage Vital Signs ED Triage Vitals  Enc Vitals Group     BP --      Pulse --      Resp --      Temp --      Temp src --      SpO2 04/18/20 1927 91 %     Weight --      Height --      Head Circumference --      Peak Flow --      Pain Score 04/18/20 1920 0      Pain Loc --      Pain Edu? --      Excl. in GC? --    No data found.  Updated Vital Signs Oxygen Saturation 91%   Visual Acuity Right Eye Distance:   Left Eye Distance:   Bilateral Distance:    Right Eye Near:   Left Eye Near:    Bilateral Near:     Physical Exam Constitutional:      Appearance: He is ill-appearing.  Pulmonary:     Effort: Tachypnea and prolonged expiration present.     Breath sounds: Decreased air movement present. Decreased breath sounds present.     Comments: Fine crackle bilateral  Skin:    General: Skin is warm.     Capillary Refill: Capillary refill takes 2 to 3 seconds.  Neurological:     General: No focal deficit present.     Mental Status: He is alert and oriented to person, place, and time. Mental status is at baseline.  Psychiatric:  Attention and Perception: Attention and perception normal.        Mood and Affect: Mood normal.      UC Treatments / Results  Labs (all labs ordered are listed, but only abnormal results are displayed) Labs Reviewed - No data to display  EKG   Radiology No results found.  Procedures Procedures (including critical care time)  Medications Ordered in UC Medications - No data to display  Initial Impression / Assessment and Plan / UC Course  I have reviewed the triage vital signs and the nursing notes.  Pertinent labs & imaging results that were available during my care of the patient were reviewed by me and considered in my medical decision making (see chart for details).     Patient recently diagnosed with COVID-19 on April 10, 2020 presents to urgent care for evaluation of shortness of breath.  On arrival he was acutely hypoxic with SPO2 is between 84 and 85.  He was placed initially on 2 L of oxygen and only improved to about 88%.  Oxygen was increased to 3 L and oxygen level improved to about 90 to 91%.  EMS was immediately called and patient was transported over to [facility], ER via EMS.   At discharge patient was stable   Final Clinical Impressions(s) / UC Diagnoses   Final diagnoses:  None   Discharge Instructions   None    ED Prescriptions    None     PDMP not reviewed this encounter.   Bing Neighbors, FNP 04/23/20 1935

## 2020-04-18 NOTE — ED Notes (Signed)
Brett Smith (wife) called for an update on pt. Please give a call back to [phone]

## 2020-04-18 NOTE — ED Triage Notes (Signed)
Pt presents for chest xray. States tested positive for COVID 12 days ago. C/o sob and low oxygen levels.

## 2020-04-19 DIAGNOSIS — U071 COVID-19: Secondary | ICD-10-CM

## 2020-04-19 DIAGNOSIS — J1282 Pneumonia due to coronavirus disease 2019: Secondary | ICD-10-CM

## 2020-04-19 DIAGNOSIS — D72829 Elevated white blood cell count, unspecified: Secondary | ICD-10-CM

## 2020-04-19 DIAGNOSIS — D72825 Bandemia: Secondary | ICD-10-CM

## 2020-04-19 DIAGNOSIS — R739 Hyperglycemia, unspecified: Secondary | ICD-10-CM

## 2020-04-19 LAB — CBC WITH DIFFERENTIAL/PLATELET
Abs Immature Granulocytes: 0.12 10*3/uL — ABNORMAL HIGH (ref 0.00–0.07)
Basophils Absolute: 0 10*3/uL (ref 0.0–0.1)
Basophils Relative: 0 %
Eosinophils Absolute: 0 10*3/uL (ref 0.0–0.5)
Eosinophils Relative: 0 %
HCT: 40 % (ref 39.0–52.0)
Hemoglobin: 13.1 g/dL (ref 13.0–17.0)
Immature Granulocytes: 1 %
Lymphocytes Relative: 8 %
Lymphs Abs: 0.9 10*3/uL (ref 0.7–4.0)
MCH: 29.5 pg (ref 26.0–34.0)
MCHC: 32.8 g/dL (ref 30.0–36.0)
MCV: 90.1 fL (ref 80.0–100.0)
Monocytes Absolute: 0.5 10*3/uL (ref 0.1–1.0)
Monocytes Relative: 5 %
Neutro Abs: 9.1 10*3/uL — ABNORMAL HIGH (ref 1.7–7.7)
Neutrophils Relative %: 86 %
Platelets: 272 10*3/uL (ref 150–400)
RBC: 4.44 MIL/uL (ref 4.22–5.81)
RDW: 12.9 % (ref 11.5–15.5)
WBC: 10.7 10*3/uL — ABNORMAL HIGH (ref 4.0–10.5)
nRBC: 0 % (ref 0.0–0.2)

## 2020-04-19 LAB — COMPREHENSIVE METABOLIC PANEL
ALT: 104 U/L — ABNORMAL HIGH (ref 0–44)
AST: 62 U/L — ABNORMAL HIGH (ref 15–41)
Albumin: 2.6 g/dL — ABNORMAL LOW (ref 3.5–5.0)
Alkaline Phosphatase: 35 U/L — ABNORMAL LOW (ref 38–126)
Anion gap: 10 (ref 5–15)
BUN: 18 mg/dL (ref 6–20)
CO2: 25 mmol/L (ref 22–32)
Calcium: 8.3 mg/dL — ABNORMAL LOW (ref 8.9–10.3)
Chloride: 99 mmol/L (ref 98–111)
Creatinine, Ser: 0.79 mg/dL (ref 0.61–1.24)
GFR calc Af Amer: >60 mL/min (ref >60)
GFR calc non Af Amer: >60 mL/min (ref >60)
Glucose, Bld: 151 mg/dL — ABNORMAL HIGH (ref 70–99)
Potassium: 4.5 mmol/L (ref 3.5–5.1)
Sodium: 134 mmol/L — ABNORMAL LOW (ref 135–145)
Total Bilirubin: 1 mg/dL (ref 0.3–1.2)
Total Protein: 6.4 g/dL — ABNORMAL LOW (ref 6.5–8.1)

## 2020-04-19 LAB — CBG MONITORING, ED
Glucose-Capillary: 127 mg/dL — ABNORMAL HIGH (ref 70–99)
Glucose-Capillary: 203 mg/dL — ABNORMAL HIGH (ref 70–99)
Glucose-Capillary: 184 mg/dL — ABNORMAL HIGH (ref 70–99)
Glucose-Capillary: 156 mg/dL — ABNORMAL HIGH (ref 70–99)
Glucose-Capillary: 150 mg/dL — ABNORMAL HIGH (ref 70–99)

## 2020-04-19 LAB — D-DIMER, QUANTITATIVE: D-Dimer, Quant: 0.72 ug/mL-FEU — ABNORMAL HIGH (ref 0.00–0.50)

## 2020-04-19 LAB — HEMOGLOBIN A1C
Hgb A1c MFr Bld: 6.2 % — ABNORMAL HIGH (ref 4.8–5.6)
Mean Plasma Glucose: 131.24 mg/dL

## 2020-04-19 LAB — PHOSPHORUS: Phosphorus: 3.5 mg/dL (ref 2.5–4.6)

## 2020-04-19 LAB — FERRITIN: Ferritin: 2191 ng/mL — ABNORMAL HIGH (ref 24–336)

## 2020-04-19 LAB — MAGNESIUM: Magnesium: 2.6 mg/dL — ABNORMAL HIGH (ref 1.7–2.4)

## 2020-04-19 LAB — HIV ANTIBODY (ROUTINE TESTING W REFLEX): HIV Screen 4th Generation wRfx: NONREACTIVE

## 2020-04-19 LAB — SARS CORONAVIRUS 2 BY RT PCR (HOSPITAL ORDER, PERFORMED IN ~~LOC~~ HOSPITAL LAB): SARS Coronavirus 2: POSITIVE — AB

## 2020-04-19 LAB — C-REACTIVE PROTEIN: CRP: 8.4 mg/dL — ABNORMAL HIGH (ref <1.0)

## 2020-04-19 MED ORDER — INSULIN ASPART 100 UNIT/ML ~~LOC~~ SOLN
0.0000 [IU] | Freq: Three times a day (TID) | SUBCUTANEOUS | Status: DC
  Administered 2020-04-19 (×2): 2 [IU] via SUBCUTANEOUS

## 2020-04-19 MED ORDER — ALBUTEROL SULFATE HFA 108 (90 BASE) MCG/ACT IN AERS
2.0000 | INHALATION_SPRAY | Freq: Four times a day (QID) | RESPIRATORY_TRACT | Status: DC
  Administered 2020-04-19 – 2020-04-26 (×28): 2 via RESPIRATORY_TRACT
  Filled 2020-04-19 (×3): qty 6.7

## 2020-04-19 MED ORDER — GUAIFENESIN-CODEINE 100-10 MG/5ML PO SOLN: 5.0000 mL | Freq: Four times a day (QID) | ORAL | Status: AC | PRN

## 2020-04-19 MED ORDER — FUROSEMIDE 10 MG/ML IJ SOLN
20.0000 mg | Freq: Two times a day (BID) | INTRAMUSCULAR | Status: AC
  Administered 2020-04-19 (×2): 20 mg via INTRAVENOUS
  Filled 2020-04-19 (×2): qty 2

## 2020-04-19 MED ORDER — METHYLPREDNISOLONE SODIUM SUCC 125 MG IJ SOLR
80.0000 mg | Freq: Two times a day (BID) | INTRAMUSCULAR | Status: DC
  Administered 2020-04-19 – 2020-04-24 (×11): 80 mg via INTRAVENOUS
  Filled 2020-04-19 (×11): qty 2

## 2020-04-19 MED ORDER — SODIUM CHLORIDE 0.9 % IV SOLN
100.0000 mg | Freq: Every day | INTRAVENOUS | Status: DC
  Administered 2020-04-20: 100 mg via INTRAVENOUS
  Filled 2020-04-19: qty 100
  Filled 2020-04-19 (×3): qty 20

## 2020-04-19 MED ORDER — IPRATROPIUM BROMIDE HFA 17 MCG/ACT IN AERS
2.0000 | INHALATION_SPRAY | Freq: Four times a day (QID) | RESPIRATORY_TRACT | Status: DC
  Administered 2020-04-19 – 2020-04-26 (×28): 2 via RESPIRATORY_TRACT
  Filled 2020-04-19 (×3): qty 12.9

## 2020-04-19 MED ORDER — SODIUM CHLORIDE 0.9 % IV SOLN
200.0000 mg | Freq: Once | INTRAVENOUS | Status: AC
  Administered 2020-04-19: 200 mg via INTRAVENOUS
  Filled 2020-04-19: qty 40

## 2020-04-19 MED ORDER — INSULIN ASPART 100 UNIT/ML ~~LOC~~ SOLN: 0.0000 [IU] | Freq: Every day | SUBCUTANEOUS | Status: DC

## 2020-04-19 NOTE — Progress Notes (Signed)
Called and updated the patient's spouse via phone.  All questions answered.  Requests daily updates.

## 2020-04-19 NOTE — ED Notes (Signed)
Pt O2 sat has improved to 91%

## 2020-04-19 NOTE — Progress Notes (Addendum)
[facility] PROGRESS NOTE    Progress Note  Brett Smith  PIR:[id number] DOB: Jan 29, 1961 DOA: 04/18/2020 PCP: Patient, No Pcp Per     Brief Narrative:   Brett Smith is an 59 y.o. male past medical history significant recent COVID-19 viral infection tested positive on 04/10/2020 in the outpatient setting presents with 2 days of worsening dyspnea and persistent productive cough.  In the outpatient setting she was started on prednisone 4 days prior to admission but despite this she continued to be short of breath.  Came into the ED was found to be hypoxic, chest x-ray showed bilateral pulmonary infiltrates, elevated inflammatory markers.  Assessment/Plan:   Acute respiratory failure with hypoxia 2/2 to Pneumonia due to COVID-19 virus Patient still requiring 3 L of oxygen to keep saturations greater than 91%. She has remained afebrile but she has a persistent leukocytosis.  Procalcitonin less than 0.1. Probably her leukocytosis likely due to steroids. Her inflammatory markers were elevated admission that are pending this morning. She started empirically on IV azithromycin baricitinib and steroids.  We will go ahead and start her on IV remdesivir. Her procalcitonin is low yield she has remained afebrile the rise in her leukocytosis likely due to steroids will discontinue azithromycin. The treatment plan and use of medications (baricitinib) and known side effects were discussed with patient/family, they were clearly explained that there is no proven definitive treatment for COVID-19 infection, any medications used here are based on published clinical articles/anecdotal data which are not peer-reviewed or randomized control trials.  Complete risks and long-term side effects are unknown, however in the best clinical judgment they seem to be of some clinical benefit rather than medical risks.  Patient/family agree with the treatment plan and want to receive the given medications. Try to keep the patient  prone for at least 16 hours a day if not prone out of bed to chair, use incentive spirometry and flutter valve at least 10 times an hour.   Hyperglycemia: With an A1c of 6.2, will start on sliding scale insulin.  She is good to be on high-dose steroids which will make her blood glucose erratic.  DVT prophylaxis: lovenxo Family Communication:none Status is: Inpatient  Remains inpatient appropriate because:Hemodynamically unstable   Dispo:  Patient From: Home  Planned Disposition: Home  Expected discharge date: 04/20/20  Medically stable for discharge: No         Code Status:     Code Status Orders  (From admission, onward)         Start     Ordered   04/19/20 0039  Full code  Continuous        04/19/20 0038        Code Status History    This patient has a current code status but no historical code status.   Advance Care Planning Activity        IV Access:    Peripheral IV   Procedures and diagnostic studies:   DG Chest Port 1 View  Result Date: 04/18/2020 CLINICAL DATA:  COVID-19 positive 12 days ago, cough, short of breath EXAM: PORTABLE CHEST 1 VIEW COMPARISON:  None. FINDINGS: Single frontal view of the chest demonstrates an unremarkable cardiac silhouette. There is multifocal bilateral ground-glass airspace disease greatest in the lung bases. No effusion or pneumothorax. No acute bony abnormalities. IMPRESSION: 1. Multifocal bilateral pneumonia compatible with COVID 19. Electronically Signed   By: Sharlet Salina M.D.   On: 04/18/2020 21:11     Medical Consultants:  None.  Anti-Infectives:   Azithromycin 1 day  Subjective:    Brett Smith he relates his breathing is unchanged.  Objective:    Vitals:   04/19/20 0530 04/19/20 0600 04/19/20 0630 04/19/20 0645  BP: 112/64 125/83 105/85   Pulse: 63 70 69 70  Resp:  (!) 28 18 (!) 26  Temp:      TempSrc:      SpO2: 90% 93% (!) 86% (!) 86%   SpO2: (!) 86 %   Intake/Output Summary  (Last 24 hours) at 04/19/2020 0701 Last data filed at 04/18/2020 2328 Gross per 24 hour  Intake 1000 ml  Output --  Net 1000 ml   There were no vitals filed for this visit.  Exam: General exam: In no acute distress. Respiratory system: Good air movement and mostly crackles on the right. Cardiovascular system: S1 & S2 heard, RRR. No JVD. Gastrointestinal system: Abdomen is nondistended, soft and nontender.  Central nervous system: Alert and oriented. No focal neurological deficits. Extremities: No pedal edema. Skin: No rashes, lesions or ulcers Psychiatry: Judgement and insight appear normal. Mood & affect appropriate.    Data Reviewed:    Labs: Basic Metabolic Panel: Recent Labs  Lab 04/18/20 2025 04/19/20 0500  NA 134* 134*  K 4.5 4.5  CL 95* 99  CO2 24 25  GLUCOSE 160* 151*  BUN 16 18  CREATININE 0.80 0.79  CALCIUM 8.7* 8.3*  MG  --  2.6*  PHOS  --  3.5   GFR CrCl cannot be calculated (Unknown ideal weight.). Liver Function Tests: Recent Labs  Lab 04/18/20 2025 04/19/20 0500  AST 79* 62*  ALT 132* 104*  ALKPHOS 42 35*  BILITOT 1.0 1.0  PROT 7.7 6.4*  ALBUMIN 3.0* 2.6*   No results for input(s): LIPASE, AMYLASE in the last 168 hours. No results for input(s): AMMONIA in the last 168 hours. Coagulation profile No results for input(s): INR, PROTIME in the last 168 hours. COVID-19 Labs  Recent Labs    04/18/20 2025 04/19/20 0500  DDIMER 0.59* 0.72*  FERRITIN 2,904*  --   LDH 332*  --   CRP 9.1*  --     Lab Results  Component Value Date   SARSCOV2NAA POSITIVE (A) 04/18/2020    CBC: Recent Labs  Lab 04/18/20 2025 04/19/20 0500  WBC 10.4 10.7*  NEUTROABS 8.8* 9.1*  HGB 14.8 13.1  HCT 45.1 40.0  MCV 91.5 90.1  PLT 261 272   Cardiac Enzymes: No results for input(s): CKTOTAL, CKMB, CKMBINDEX, TROPONINI in the last 168 hours. BNP (last 3 results) No results for input(s): PROBNP in the last 8760 hours. CBG: Recent Labs  Lab 04/19/20 0135   GLUCAP 127*   D-Dimer: Recent Labs    04/18/20 2025 04/19/20 0500  DDIMER 0.59* 0.72*   Hgb A1c: Recent Labs    04/19/20 0605  HGBA1C 6.2*   Lipid Profile: Recent Labs    04/18/20 2025  TRIG 147   Thyroid function studies: No results for input(s): TSH, T4TOTAL, T3FREE, THYROIDAB in the last 72 hours.  Invalid input(s): FREET3 Anemia work up: Recent Labs    04/18/20 2025  FERRITIN 2,904*   Sepsis Labs: Recent Labs  Lab 04/18/20 2025 04/18/20 2140 04/19/20 0500  PROCALCITON <0.10  --   --   WBC 10.4  --  10.7*  LATICACIDVEN 2.6* 2.2*  --    Microbiology Recent Results (from the past 240 hour(s))  SARS Coronavirus 2 by RT PCR (hospital order, performed  in [facility] hospital lab) Nasopharyngeal Nasopharyngeal Swab     Status: Abnormal   Collection Time: 04/18/20 10:18 PM   Specimen: Nasopharyngeal Swab  Result Value Ref Range Status   SARS Coronavirus 2 POSITIVE (A) NEGATIVE Final    Comment: RESULT CALLED TO, READ BACK BY AND VERIFIED WITH: [email or web address] @0023  04/19/20 EB (NOTE) SARS-CoV-2 target nucleic acids are DETECTED  SARS-CoV-2 RNA is generally detectable in upper respiratory specimens  during the acute phase of infection.  Positive results are indicative  of the presence of the identified virus, but do not rule out bacterial infection or co-infection with other pathogens not detected by the test.  Clinical correlation with patient history and  other diagnostic information is necessary to determine patient infection status.  The expected result is negative.  Fact Sheet for Patients:   06/19/20   Fact Sheet for Healthcare Providers:   BoilerBrush.com.cy    This test is not yet approved or cleared by the https://pope.com/ FDA and  has been authorized for detection and/or diagnosis of SARS-CoV-2 by FDA under an Emergency Use Authorization (EUA).  This EUA will remain in effect (meaning this test  can  be used) for the duration of  the COVID-19 declaration under Section 564(b)(1) of the Act, 21 U.S.C. section 360-bbb-3(b)(1), unless the authorization is terminated or revoked sooner.  Performed at [facility], [address] [place], [place]      Medications:   . albuterol  2 puff Inhalation Q6H  . vitamin C  500 mg Oral Daily  . azithromycin  250 mg Oral Daily  . baricitinib  4 mg Oral Daily  . cholecalciferol  1,000 Units Oral Daily  . enoxaparin (LOVENOX) injection  40 mg Subcutaneous Daily  . furosemide  20 mg Intravenous BID  . insulin aspart  0-5 Units Subcutaneous QHS  . insulin aspart  0-9 Units Subcutaneous TID WC  . ipratropium  2 puff Inhalation Q6H  . methylPREDNISolone (SOLU-MEDROL) injection  40 mg Intravenous BID   Followed by  . [START ON 04/22/2020] predniSONE  50 mg Oral Q breakfast  . multivitamin with minerals  1 tablet Oral Daily  . zinc sulfate  220 mg Oral Daily   Continuous Infusions:    LOS: 1 day   04/24/2020  [facility]  04/19/2020, 7:01 AM

## 2020-04-19 NOTE — ED Notes (Signed)
Pt [place] bumped to 6L at this time and pt unable to maintain O2 sat greater than 86% at this time. Will alert RT for possible hi-flo [place].

## 2020-04-20 ENCOUNTER — Encounter (HOSPITAL_COMMUNITY): Payer: Self-pay | Admitting: Internal Medicine

## 2020-04-20 LAB — CBC WITH DIFFERENTIAL/PLATELET
Abs Immature Granulocytes: 0.19 10*3/uL — ABNORMAL HIGH (ref 0.00–0.07)
Basophils Absolute: 0 10*3/uL (ref 0.0–0.1)
Basophils Relative: 0 %
Eosinophils Absolute: 0 10*3/uL (ref 0.0–0.5)
Eosinophils Relative: 0 %
HCT: 39.6 % (ref 39.0–52.0)
Hemoglobin: 13.1 g/dL (ref 13.0–17.0)
Immature Granulocytes: 1 %
Lymphocytes Relative: 7 %
Lymphs Abs: 0.9 10*3/uL (ref 0.7–4.0)
MCH: 30.3 pg (ref 26.0–34.0)
MCHC: 33.1 g/dL (ref 30.0–36.0)
MCV: 91.5 fL (ref 80.0–100.0)
Monocytes Absolute: 0.9 10*3/uL (ref 0.1–1.0)
Monocytes Relative: 6 %
Neutro Abs: 12.1 10*3/uL — ABNORMAL HIGH (ref 1.7–7.7)
Neutrophils Relative %: 86 %
Platelets: 350 10*3/uL (ref 150–400)
RBC: 4.33 MIL/uL (ref 4.22–5.81)
RDW: 13.1 % (ref 11.5–15.5)
WBC: 14.1 10*3/uL — ABNORMAL HIGH (ref 4.0–10.5)
nRBC: 0 % (ref 0.0–0.2)

## 2020-04-20 LAB — COMPREHENSIVE METABOLIC PANEL
ALT: 96 U/L — ABNORMAL HIGH (ref 0–44)
AST: 53 U/L — ABNORMAL HIGH (ref 15–41)
Albumin: 2.4 g/dL — ABNORMAL LOW (ref 3.5–5.0)
Alkaline Phosphatase: 36 U/L — ABNORMAL LOW (ref 38–126)
Anion gap: 10 (ref 5–15)
BUN: 24 mg/dL — ABNORMAL HIGH (ref 6–20)
CO2: 26 mmol/L (ref 22–32)
Calcium: 8.4 mg/dL — ABNORMAL LOW (ref 8.9–10.3)
Chloride: 99 mmol/L (ref 98–111)
Creatinine, Ser: 0.83 mg/dL (ref 0.61–1.24)
GFR calc Af Amer: >60 mL/min (ref >60)
GFR calc non Af Amer: >60 mL/min (ref >60)
Glucose, Bld: 164 mg/dL — ABNORMAL HIGH (ref 70–99)
Potassium: 4.3 mmol/L (ref 3.5–5.1)
Sodium: 135 mmol/L (ref 135–145)
Total Bilirubin: 1.1 mg/dL (ref 0.3–1.2)
Total Protein: 6.6 g/dL (ref 6.5–8.1)

## 2020-04-20 LAB — MAGNESIUM: Magnesium: 2.6 mg/dL — ABNORMAL HIGH (ref 1.7–2.4)

## 2020-04-20 LAB — CBG MONITORING, ED
Glucose-Capillary: 166 mg/dL — ABNORMAL HIGH (ref 70–99)
Glucose-Capillary: 160 mg/dL — ABNORMAL HIGH (ref 70–99)
Glucose-Capillary: 184 mg/dL — ABNORMAL HIGH (ref 70–99)

## 2020-04-20 LAB — GLUCOSE, CAPILLARY: Glucose-Capillary: 147 mg/dL — ABNORMAL HIGH (ref 70–99)

## 2020-04-20 LAB — FERRITIN: Ferritin: 2237 ng/mL — ABNORMAL HIGH (ref 24–336)

## 2020-04-20 LAB — PHOSPHORUS: Phosphorus: 4.2 mg/dL (ref 2.5–4.6)

## 2020-04-20 LAB — D-DIMER, QUANTITATIVE: D-Dimer, Quant: 0.73 ug/mL-FEU — ABNORMAL HIGH (ref 0.00–0.50)

## 2020-04-20 LAB — C-REACTIVE PROTEIN: CRP: 6.9 mg/dL — ABNORMAL HIGH (ref <1.0)

## 2020-04-20 MED ORDER — INSULIN ASPART 100 UNIT/ML ~~LOC~~ SOLN: 0.0000 [IU] | Freq: Every day | SUBCUTANEOUS | Status: DC

## 2020-04-20 MED ORDER — INSULIN ASPART 100 UNIT/ML ~~LOC~~ SOLN
0.0000 [IU] | Freq: Three times a day (TID) | SUBCUTANEOUS | Status: DC
  Administered 2020-04-20 – 2020-04-21 (×6): 2 [IU] via SUBCUTANEOUS
  Administered 2020-04-22: 1 [IU] via SUBCUTANEOUS
  Administered 2020-04-22 – 2020-04-23 (×3): 2 [IU] via SUBCUTANEOUS
  Administered 2020-04-24: 5 [IU] via SUBCUTANEOUS
  Administered 2020-04-24: 3 [IU] via SUBCUTANEOUS
  Administered 2020-04-25 (×2): 2 [IU] via SUBCUTANEOUS
  Administered 2020-04-26: 1 [IU] via SUBCUTANEOUS

## 2020-04-20 MED ORDER — AEROCHAMBER PLUS FLO-VU LARGE MISC
Status: AC
  Administered 2020-04-20: 1
  Filled 2020-04-20: qty 1

## 2020-04-20 MED ORDER — INSULIN ASPART 100 UNIT/ML ~~LOC~~ SOLN
3.0000 [IU] | Freq: Three times a day (TID) | SUBCUTANEOUS | Status: DC
  Administered 2020-04-20 – 2020-04-26 (×18): 3 [IU] via SUBCUTANEOUS

## 2020-04-20 NOTE — ED Notes (Signed)
Daughter brought bag in for pt that was taken to his room by NS.  Daughter updated per [place], [place].  Phone number given to daughter to call for any questions or updates.

## 2020-04-20 NOTE — Progress Notes (Signed)
[facility] PROGRESS NOTE    Progress Note  Brett Smith  ZOX:[id number] DOB: 06/27/1961 DOA: 04/18/2020 PCP: Patient, No Pcp Per     Brief Narrative:   Brett Smith is an 59 y.o. male past medical history significant recent COVID-19 viral infection tested positive on 04/10/2020 in the outpatient setting presents with 2 days of worsening dyspnea and persistent productive cough.  In the outpatient setting she was started on prednisone 4 days prior to admission but despite this she continued to be short of breath.  Came into the ED was found to be hypoxic, chest x-ray showed bilateral pulmonary infiltrates, elevated inflammatory markers.  Assessment/Plan:   Acute respiratory failure with hypoxia 2/2 to Pneumonia due to COVID-19 virus: Patient oxygen requirements have worsened he is now requiring 15 L high flow nasal cannula to keep saturations greater than 90%. Try to keep his oxygen saturation is greater than 90% he does not need to be 100%. He has remained afebrile, CBC is pending this morning. He was started empirically on IV remdesivir, baricitinib and steroids his procalcitonin is low yield. His leukocytosis is likely due to steroids. Inflammatory markers were elevated on admission and they are pending this morning. Try to keep the patient prone for at least 16 hours a day, not prone out of bed to chair, encourage incentive spirometry and flutter valve.  Hyperglycemia: With an A1c of 6.2, will start on sliding scale insulin.  His blood glucose has been fairly controlled. DVT prophylaxis: lovenxo Family Communication:none Status is: Inpatient  Remains inpatient appropriate because:Hemodynamically unstable   Dispo:  Patient From: Home  Planned Disposition: Home  Expected discharge date: 04/20/20  Medically stable for discharge: No patient is now medically stable for discharge his oxygen requirements are increasing.     Code Status:     Code Status Orders  (From admission,  onward)         Start     Ordered   04/19/20 0039  Full code  Continuous        04/19/20 0038        Code Status History    This patient has a current code status but no historical code status.   Advance Care Planning Activity        IV Access:    Peripheral IV   Procedures and diagnostic studies:   DG Chest Port 1 View  Result Date: 04/18/2020 CLINICAL DATA:  COVID-19 positive 12 days ago, cough, short of breath EXAM: PORTABLE CHEST 1 VIEW COMPARISON:  None. FINDINGS: Single frontal view of the chest demonstrates an unremarkable cardiac silhouette. There is multifocal bilateral ground-glass airspace disease greatest in the lung bases. No effusion or pneumothorax. No acute bony abnormalities. IMPRESSION: 1. Multifocal bilateral pneumonia compatible with COVID 19. Electronically Signed   By: Sharlet Salina M.D.   On: 04/18/2020 21:11     Medical Consultants:    None.  Anti-Infectives:   Azithromycin 1 day  Subjective:    [name] he relates his breathing is unchanged.  Objective:    Vitals:   04/20/20 0515 04/20/20 0520 04/20/20 0530 04/20/20 0600  BP:    94/67  Pulse: (!) 56 (!) 55 67 (!) 55  Resp: (!) 22 (!) 24 (!) 21 (!) 24  Temp:      TempSrc:      SpO2: 96% 96% 97% 98%   SpO2: 98 % O2 Flow Rate (L/min): 15 L/min   Intake/Output Summary (Last 24 hours) at 04/20/2020 8119 Last data  filed at 04/20/2020 0335 Gross per 24 hour  Intake --  Output 1100 ml  Net -1100 ml   There were no vitals filed for this visit.  Exam: General exam: In no acute distress. Respiratory system: Good air movement with crackles bilaterally. Cardiovascular system: S1 & S2 heard, RRR. No JVD. Gastrointestinal system: Abdomen is nondistended, soft and nontender.  Extremities: No pedal edema. Skin: No rashes, lesions or ulcers  Data Reviewed:    Labs: Basic Metabolic Panel: Recent Labs  Lab 04/18/20 2025 04/18/20 2025 04/19/20 0500 04/20/20 0529  NA 134*  --   134* 135  K 4.5   < > 4.5 4.3  CL 95*  --  99 99  CO2 24  --  25 26  GLUCOSE 160*  --  151* 164*  BUN 16  --  18 24*  CREATININE 0.80  --  0.79 0.83  CALCIUM 8.7*  --  8.3* 8.4*  MG  --   --  2.6* 2.6*  PHOS  --   --  3.5 4.2   < > = values in this interval not displayed.   GFR CrCl cannot be calculated (Unknown ideal weight.). Liver Function Tests: Recent Labs  Lab 04/18/20 2025 04/19/20 0500 04/20/20 0529  AST 79* 62* 53*  ALT 132* 104* 96*  ALKPHOS 42 35* 36*  BILITOT 1.0 1.0 1.1  PROT 7.7 6.4* 6.6  ALBUMIN 3.0* 2.6* 2.4*   No results for input(s): LIPASE, AMYLASE in the last 168 hours. No results for input(s): AMMONIA in the last 168 hours. Coagulation profile No results for input(s): INR, PROTIME in the last 168 hours. COVID-19 Labs  Recent Labs    04/18/20 2025 04/19/20 0500 04/20/20 0529  DDIMER 0.59* 0.72* 0.73*  FERRITIN 2,904* 2,191*  --   LDH 332*  --   --   CRP 9.1* 8.4*  --     Lab Results  Component Value Date   SARSCOV2NAA POSITIVE (A) 04/18/2020    CBC: Recent Labs  Lab 04/18/20 2025 04/19/20 0500 04/20/20 0529  WBC 10.4 10.7* PENDING  NEUTROABS 8.8* 9.1* PENDING  HGB 14.8 13.1 13.1  HCT 45.1 40.0 39.6  MCV 91.5 90.1 91.5  PLT 261 272 350   Cardiac Enzymes: No results for input(s): CKTOTAL, CKMB, CKMBINDEX, TROPONINI in the last 168 hours. BNP (last 3 results) No results for input(s): PROBNP in the last 8760 hours. CBG: Recent Labs  Lab 04/19/20 0135 04/19/20 0825 04/19/20 1251 04/19/20 1650 04/19/20 2234  GLUCAP 127* 203* 184* 156* 150*   D-Dimer: Recent Labs    04/19/20 0500 04/20/20 0529  DDIMER 0.72* 0.73*   Hgb A1c: Recent Labs    04/19/20 0605  HGBA1C 6.2*   Lipid Profile: Recent Labs    04/18/20 2025  TRIG 147   Thyroid function studies: No results for input(s): TSH, T4TOTAL, T3FREE, THYROIDAB in the last 72 hours.  Invalid input(s): FREET3 Anemia work up: Recent Labs    04/18/20 2025  04/19/20 0500  FERRITIN 2,904* 2,191*   Sepsis Labs: Recent Labs  Lab 04/18/20 2025 04/18/20 2140 04/19/20 0500 04/20/20 0529  PROCALCITON <0.10  --   --   --   WBC 10.4  --  10.7* PENDING  LATICACIDVEN 2.6* 2.2*  --   --    Microbiology Recent Results (from the past 240 hour(s))  Blood Culture (routine x 2)     Status: None (Preliminary result)   Collection Time: 04/18/20  8:15 PM   Specimen: BLOOD  RIGHT ARM  Result Value Ref Range Status   Specimen Description BLOOD RIGHT ARM  Final   Special Requests   Final    BOTTLES DRAWN AEROBIC AND ANAEROBIC Blood Culture adequate volume   Culture   Final    NO GROWTH < 12 HOURS Performed at [facility], [address] [place], [place] [address]    Report Status PENDING  Incomplete  Blood Culture (routine x 2)     Status: None (Preliminary result)   Collection Time: 04/18/20  8:28 PM   Specimen: BLOOD RIGHT HAND  Result Value Ref Range Status   Specimen Description BLOOD RIGHT HAND  Final   Special Requests   Final    BOTTLES DRAWN AEROBIC ONLY Blood Culture adequate volume   Culture   Final    NO GROWTH < 12 HOURS Performed at [facility], [address] [place], [place] [address]    Report Status PENDING  Incomplete  SARS Coronavirus 2 by RT PCR (hospital order, performed in [facility] hospital lab) Nasopharyngeal Nasopharyngeal Swab     Status: Abnormal   Collection Time: 04/18/20 10:18 PM   Specimen: Nasopharyngeal Swab  Result Value Ref Range Status   SARS Coronavirus 2 POSITIVE (A) NEGATIVE Final    Comment: RESULT CALLED TO, READ BACK BY AND VERIFIED WITH: J,FAWATZKI @0023  04/19/20 EB (NOTE) SARS-CoV-2 target nucleic acids are DETECTED  SARS-CoV-2 RNA is generally detectable in upper respiratory specimens  during the acute phase of infection.  Positive results are indicative  of the presence of the identified virus, but do not rule out bacterial infection or co-infection with other pathogens  not detected by the test.  Clinical correlation with patient history and  other diagnostic information is necessary to determine patient infection status.  The expected result is negative.  Fact Sheet for Patients:   06/19/20   Fact Sheet for Healthcare Providers:   BoilerBrush.com.cy    This test is not yet approved or cleared by the https://pope.com/ FDA and  has been authorized for detection and/or diagnosis of SARS-CoV-2 by FDA under an Emergency Use Authorization (EUA).  This EUA will remain in effect (meaning this test can  be used) for the duration of  the COVID-19 declaration under Section 564(b)(1) of the Act, 21 U.S.C. section 360-bbb-3(b)(1), unless the authorization is terminated or revoked sooner.  Performed at [facility], [address], [place], [place]      Medications:   . albuterol  2 puff Inhalation Q6H  . vitamin C  500 mg Oral Daily  . baricitinib  4 mg Oral Daily  . cholecalciferol  1,000 Units Oral Daily  . enoxaparin (LOVENOX) injection  40 mg Subcutaneous Daily  . insulin aspart  0-5 Units Subcutaneous QHS  . insulin aspart  0-9 Units Subcutaneous TID WC  . ipratropium  2 puff Inhalation Q6H  . methylPREDNISolone (SOLU-MEDROL) injection  80 mg Intravenous Q12H  . multivitamin with minerals  1 tablet Oral Daily  . zinc sulfate  220 mg Oral Daily   Continuous Infusions: . remdesivir 100 mg in NS 100 mL        LOS: 2 days   [id number]  [facility]  04/20/2020, 7:02 AM

## 2020-04-20 NOTE — ED Notes (Signed)
  Patient given scheduled albuterol/atrovent puffs with large spacer to help facilitate medication administration.  Patient dropping SPO2 level at rest and takes a while to come back up.  Nakisha RT was consulted and advised to place patient on NRB mask 15L in addition to 15L [place].  Patient tolerating well and SPO2 level 93-94%

## 2020-04-20 NOTE — ED Notes (Signed)
Called report to 5W floor RN.

## 2020-04-20 NOTE — ED Notes (Signed)
Paulla Fore (pt wife)  wants to speak to nurse for update. Please call [phone]

## 2020-04-21 LAB — COMPREHENSIVE METABOLIC PANEL
ALT: 98 U/L — ABNORMAL HIGH (ref 0–44)
AST: 48 U/L — ABNORMAL HIGH (ref 15–41)
Albumin: 2.6 g/dL — ABNORMAL LOW (ref 3.5–5.0)
Alkaline Phosphatase: 36 U/L — ABNORMAL LOW (ref 38–126)
Anion gap: 9 (ref 5–15)
BUN: 21 mg/dL — ABNORMAL HIGH (ref 6–20)
CO2: 26 mmol/L (ref 22–32)
Calcium: 8.7 mg/dL — ABNORMAL LOW (ref 8.9–10.3)
Chloride: 102 mmol/L (ref 98–111)
Creatinine, Ser: 0.78 mg/dL (ref 0.61–1.24)
GFR calc Af Amer: >60 mL/min (ref >60)
GFR calc non Af Amer: >60 mL/min (ref >60)
Glucose, Bld: 156 mg/dL — ABNORMAL HIGH (ref 70–99)
Potassium: 4.4 mmol/L (ref 3.5–5.1)
Sodium: 137 mmol/L (ref 135–145)
Total Bilirubin: 0.8 mg/dL (ref 0.3–1.2)
Total Protein: 6.6 g/dL (ref 6.5–8.1)

## 2020-04-21 LAB — CBC WITH DIFFERENTIAL/PLATELET
Abs Immature Granulocytes: 0.34 10*3/uL — ABNORMAL HIGH (ref 0.00–0.07)
Basophils Absolute: 0 10*3/uL (ref 0.0–0.1)
Basophils Relative: 0 %
Eosinophils Absolute: 0 10*3/uL (ref 0.0–0.5)
Eosinophils Relative: 0 %
HCT: 42.2 % (ref 39.0–52.0)
Hemoglobin: 13.8 g/dL (ref 13.0–17.0)
Immature Granulocytes: 2 %
Lymphocytes Relative: 7 %
Lymphs Abs: 1.1 10*3/uL (ref 0.7–4.0)
MCH: 29.7 pg (ref 26.0–34.0)
MCHC: 32.7 g/dL (ref 30.0–36.0)
MCV: 90.9 fL (ref 80.0–100.0)
Monocytes Absolute: 1.1 10*3/uL — ABNORMAL HIGH (ref 0.1–1.0)
Monocytes Relative: 8 %
Neutro Abs: 12.4 10*3/uL — ABNORMAL HIGH (ref 1.7–7.7)
Neutrophils Relative %: 83 %
Platelets: 480 10*3/uL — ABNORMAL HIGH (ref 150–400)
RBC: 4.64 MIL/uL (ref 4.22–5.81)
RDW: 13.1 % (ref 11.5–15.5)
WBC: 15 10*3/uL — ABNORMAL HIGH (ref 4.0–10.5)
nRBC: 0 % (ref 0.0–0.2)

## 2020-04-21 LAB — GLUCOSE, CAPILLARY
Glucose-Capillary: 156 mg/dL — ABNORMAL HIGH (ref 70–99)
Glucose-Capillary: 153 mg/dL — ABNORMAL HIGH (ref 70–99)
Glucose-Capillary: 187 mg/dL — ABNORMAL HIGH (ref 70–99)
Glucose-Capillary: 182 mg/dL — ABNORMAL HIGH (ref 70–99)

## 2020-04-21 LAB — C-REACTIVE PROTEIN: CRP: 2.8 mg/dL — ABNORMAL HIGH (ref <1.0)

## 2020-04-21 LAB — D-DIMER, QUANTITATIVE: D-Dimer, Quant: 0.69 ug/mL-FEU — ABNORMAL HIGH (ref 0.00–0.50)

## 2020-04-21 LAB — PHOSPHORUS: Phosphorus: 4 mg/dL (ref 2.5–4.6)

## 2020-04-21 LAB — MAGNESIUM: Magnesium: 2.5 mg/dL — ABNORMAL HIGH (ref 1.7–2.4)

## 2020-04-21 LAB — FERRITIN: Ferritin: 1921 ng/mL — ABNORMAL HIGH (ref 24–336)

## 2020-04-21 MED ORDER — DIPHENHYDRAMINE HCL 50 MG/ML IJ SOLN: 12.5000 mg | Freq: Four times a day (QID) | INTRAMUSCULAR | Status: DC | PRN

## 2020-04-21 MED ORDER — DIPHENHYDRAMINE HCL 50 MG/ML IJ SOLN
12.5000 mg | Freq: Four times a day (QID) | INTRAMUSCULAR | Status: DC
  Administered 2020-04-21 – 2020-04-23 (×8): 12.5 mg via INTRAVENOUS
  Filled 2020-04-21 (×8): qty 1

## 2020-04-21 NOTE — Progress Notes (Signed)
[facility] PROGRESS NOTE    Progress Note  Brett Smith  GEZ:[id number] DOB: 02-08-1961 DOA: 04/18/2020 PCP: Patient, No Pcp Per     Brief Narrative:   Brett Smith is an 59 y.o. male past medical history significant recent COVID-19 viral infection tested positive on 04/10/2020 in the outpatient setting presents with 2 days of worsening dyspnea and persistent productive cough.  In the outpatient setting she was started on prednisone 4 days prior to admission but despite this she continued to be short of breath.  Came into the ED was found to be hypoxic, chest x-ray showed bilateral pulmonary infiltrates, elevated inflammatory markers.  Assessment/Plan:   Acute respiratory failure with hypoxia 2/2 to Pneumonia due to COVID-19 virus: Will require 15 L of oxygen to keep saturations 90%. Has remained afebrile, persistent leukocytosis likely due to steroids. He was started empirically on IV remdesivir and steroids, continue baricitinib. His inflammatory markers continue to improve. He has done a great job proning, continue to encourage the patient to be prone for at least 16 hours a day if not prone out of bed to chair continue to use incentive spirometry. He is more than 14 days out from his initial presenting symptoms. At this point in time he has developed an urticarial rash which is new, remdesivir has about 5% chance of causing an urticarial rash and in speaking to the pharmacy also does baricitinib both lower rate which concerning is that they both can cause anaphylaxis and he is on steroids which should be working against these types of reactions I will go ahead and discontinue the baricitinib and the IV remdesivir continue steroids.  Hyperglycemia: With an A1c of 6.2, will start on sliding scale insulin.  His blood glucose has been fairly controlled.  Diffuse urticarial rash: Most likely due to medication go ahead continue steroids start him on IV Benadryl hold baricitinib and  remdesivir.   DVT prophylaxis: lovenxo Family Communication:none Status is: Inpatient  Remains inpatient appropriate because:Hemodynamically unstable   Dispo:  Patient From: Home  Planned Disposition: Home  Expected discharge date: 04/20/20  Medically stable for discharge: No patient is now medically stable for discharge his oxygen requirements are increasing.     Code Status:     Code Status Orders  (From admission, onward)         Start     Ordered   04/19/20 0039  Full code  Continuous        04/19/20 0038        Code Status History    This patient has a current code status but no historical code status.   Advance Care Planning Activity        IV Access:    Peripheral IV   Procedures and diagnostic studies:   No results found.   Medical Consultants:    None.  Anti-Infectives:   Azithromycin 1 day  Subjective:    Brett Smith relates his breathing is unchanged please not developed this itching and diffuse  rash  Objective:    Vitals:   04/20/20 2100 04/20/20 2316 04/21/20 0400 04/21/20 0747  BP: 105/90 132/73 (!) 100/59 (!) 110/56  Pulse: 70 79 62 63  Resp: (!) 25 20 20 17   Temp:  97.8 F (36.6 C) (!) 97.5 F (36.4 C) 97.6 F (36.4 C)  TempSrc:  Oral Oral Oral  SpO2: 99% 90% 90% 91%  Weight:  77.6 kg    Height:       SpO2: 91 % O2  Flow Rate (L/min): 15 L/min FiO2 (%): 100 %   Intake/Output Summary (Last 24 hours) at 04/21/2020 0920 Last data filed at 04/21/2020 0840 Gross per 24 hour  Intake 240 ml  Output --  Net 240 ml   Filed Weights   04/20/20 1350 04/20/20 2316  Weight: 84.4 kg 77.6 kg    Exam: General exam: In no acute distress. Respiratory system: Has good air movement with diffuse crackles, I do not appreciate enlargement of the tongue. Cardiovascular system: S1 & S2 heard, RRR. No JVD. Gastrointestinal system: Abdomen is nondistended, soft and nontender.  Extremities: No pedal edema. Skin: Diffuse  urticarial rash in both lower extremity upper extremity abdomen chest and back he relates he has itching, has no trouble breathing. Psychiatry: Judgement and insight appear normal. Mood & affect appropriate.  Data Reviewed:    Labs: Basic Metabolic Panel: Recent Labs  Lab 04/18/20 2025 04/18/20 2025 04/19/20 0500 04/19/20 0500 04/20/20 0529 04/21/20 0801  NA 134*  --  134*  --  135 137  K 4.5   < > 4.5   < > 4.3 4.4  CL 95*  --  99  --  99 102  CO2 24  --  25  --  26 26  GLUCOSE 160*  --  151*  --  164* 156*  BUN 16  --  18  --  24* 21*  CREATININE 0.80  --  0.79  --  0.83 0.78  CALCIUM 8.7*  --  8.3*  --  8.4* 8.7*  MG  --   --  2.6*  --  2.6* 2.5*  PHOS  --   --  3.5  --  4.2 4.0   < > = values in this interval not displayed.   GFR Estimated Creatinine Clearance: 91.7 mL/min (by C-G formula based on SCr of 0.78 mg/dL). Liver Function Tests: Recent Labs  Lab 04/18/20 2025 04/19/20 0500 04/20/20 0529 04/21/20 0801  AST 79* 62* 53* 48*  ALT 132* 104* 96* 98*  ALKPHOS 42 35* 36* 36*  BILITOT 1.0 1.0 1.1 0.8  PROT 7.7 6.4* 6.6 6.6  ALBUMIN 3.0* 2.6* 2.4* 2.6*   No results for input(s): LIPASE, AMYLASE in the last 168 hours. No results for input(s): AMMONIA in the last 168 hours. Coagulation profile No results for input(s): INR, PROTIME in the last 168 hours. COVID-19 Labs  Recent Labs    04/18/20 2025 04/18/20 2025 04/19/20 0500 04/20/20 0529 04/21/20 0801  DDIMER 0.59*   < > 0.72* 0.73* 0.69*  FERRITIN 2,904*  --  2,191* 2,237*  --   LDH 332*  --   --   --   --   CRP 9.1*  --  8.4* 6.9*  --    < > = values in this interval not displayed.    Lab Results  Component Value Date   SARSCOV2NAA POSITIVE (A) 04/18/2020    CBC: Recent Labs  Lab 04/18/20 2025 04/19/20 0500 04/20/20 0529 04/21/20 0801  WBC 10.4 10.7* 14.1* 15.0*  NEUTROABS 8.8* 9.1* 12.1* 12.4*  HGB 14.8 13.1 13.1 13.8  HCT 45.1 40.0 39.6 42.2  MCV 91.5 90.1 91.5 90.9  PLT 261 272  350 480*   Cardiac Enzymes: No results for input(s): CKTOTAL, CKMB, CKMBINDEX, TROPONINI in the last 168 hours. BNP (last 3 results) No results for input(s): PROBNP in the last 8760 hours. CBG: Recent Labs  Lab 04/20/20 0827 04/20/20 1230 04/20/20 1757 04/20/20 2309 04/21/20 0855  GLUCAP 166* 160*  184* 147* 156*   D-Dimer: Recent Labs    04/20/20 0529 04/21/20 0801  DDIMER 0.73* 0.69*   Hgb A1c: Recent Labs    04/19/20 0605  HGBA1C 6.2*   Lipid Profile: Recent Labs    04/18/20 2025  TRIG 147   Thyroid function studies: No results for input(s): TSH, T4TOTAL, T3FREE, THYROIDAB in the last 72 hours.  Invalid input(s): FREET3 Anemia work up: Recent Labs    04/19/20 0500 04/20/20 0529  FERRITIN 2,191* 2,237*   Sepsis Labs: Recent Labs  Lab 04/18/20 2025 04/18/20 2140 04/19/20 0500 04/20/20 0529 04/21/20 0801  PROCALCITON <0.10  --   --   --   --   WBC 10.4  --  10.7* 14.1* 15.0*  LATICACIDVEN 2.6* 2.2*  --   --   --    Microbiology Recent Results (from the past 240 hour(s))  Blood Culture (routine x 2)     Status: None (Preliminary result)   Collection Time: 04/18/20  8:15 PM   Specimen: BLOOD RIGHT ARM  Result Value Ref Range Status   Specimen Description BLOOD RIGHT ARM  Final   Special Requests   Final    BOTTLES DRAWN AEROBIC AND ANAEROBIC Blood Culture adequate volume   Culture   Final    NO GROWTH 2 DAYS Performed at [facility], [address] [place], [place] [address]    Report Status PENDING  Incomplete  Blood Culture (routine x 2)     Status: None (Preliminary result)   Collection Time: 04/18/20  8:28 PM   Specimen: BLOOD RIGHT HAND  Result Value Ref Range Status   Specimen Description BLOOD RIGHT HAND  Final   Special Requests   Final    BOTTLES DRAWN AEROBIC ONLY Blood Culture adequate volume   Culture   Final    NO GROWTH 2 DAYS Performed at [facility], [address], [place], [place] [address]    Report  Status PENDING  Incomplete  SARS Coronavirus 2 by RT PCR (hospital order, performed in [facility] hospital lab) Nasopharyngeal Nasopharyngeal Swab     Status: Abnormal   Collection Time: 04/18/20 10:18 PM   Specimen: Nasopharyngeal Swab  Result Value Ref Range Status   SARS Coronavirus 2 POSITIVE (A) NEGATIVE Final    Comment: RESULT CALLED TO, READ BACK BY AND VERIFIED WITH: J,FAWATZKI @0023  04/19/20 EB (NOTE) SARS-CoV-2 target nucleic acids are DETECTED  SARS-CoV-2 RNA is generally detectable in upper respiratory specimens  during the acute phase of infection.  Positive results are indicative  of the presence of the identified virus, but do not rule out bacterial infection or co-infection with other pathogens not detected by the test.  Clinical correlation with patient history and  other diagnostic information is necessary to determine patient infection status.  The expected result is negative.  Fact Sheet for Patients:   06/19/20   Fact Sheet for Healthcare Providers:   BoilerBrush.com.cy    This test is not yet approved or cleared by the https://pope.com/ FDA and  has been authorized for detection and/or diagnosis of SARS-CoV-2 by FDA under an Emergency Use Authorization (EUA).  This EUA will remain in effect (meaning this test can  be used) for the duration of  the COVID-19 declaration under Section 564(b)(1) of the Act, 21 U.S.C. section 360-bbb-3(b)(1), unless the authorization is terminated or revoked sooner.  Performed at [facility], [address], [place], [place]  Medications:    albuterol  2 puff Inhalation Q6H   vitamin C  500 mg Oral Daily   baricitinib  4 mg Oral Daily   cholecalciferol  1,000 Units Oral Daily   enoxaparin (LOVENOX) injection  40 mg Subcutaneous Daily   insulin aspart  0-5 Units Subcutaneous QHS   insulin aspart  0-9 Units Subcutaneous TID WC   insulin  aspart  3 Units Subcutaneous TID WC   ipratropium  2 puff Inhalation Q6H   methylPREDNISolone (SOLU-MEDROL) injection  80 mg Intravenous Q12H   multivitamin with minerals  1 tablet Oral Daily   zinc sulfate  220 mg Oral Daily   Continuous Infusions:  remdesivir 100 mg in NS 100 mL Stopped (04/20/20 1111)      LOS: 3 days   Marinda Elk  [facility]  04/21/2020, 9:20 AM

## 2020-04-22 LAB — FERRITIN: Ferritin: 1336 ng/mL — ABNORMAL HIGH (ref 24–336)

## 2020-04-22 LAB — CBC WITH DIFFERENTIAL/PLATELET
Abs Immature Granulocytes: 0.6 10*3/uL — ABNORMAL HIGH (ref 0.00–0.07)
Basophils Absolute: 0.1 10*3/uL (ref 0.0–0.1)
Basophils Relative: 1 %
Eosinophils Absolute: 0 10*3/uL (ref 0.0–0.5)
Eosinophils Relative: 0 %
HCT: 40.7 % (ref 39.0–52.0)
Hemoglobin: 13.1 g/dL (ref 13.0–17.0)
Immature Granulocytes: 4 %
Lymphocytes Relative: 6 %
Lymphs Abs: 0.9 10*3/uL (ref 0.7–4.0)
MCH: 29.8 pg (ref 26.0–34.0)
MCHC: 32.2 g/dL (ref 30.0–36.0)
MCV: 92.7 fL (ref 80.0–100.0)
Monocytes Absolute: 1 10*3/uL (ref 0.1–1.0)
Monocytes Relative: 7 %
Neutro Abs: 12 10*3/uL — ABNORMAL HIGH (ref 1.7–7.7)
Neutrophils Relative %: 82 %
Platelets: 319 10*3/uL (ref 150–400)
RBC: 4.39 MIL/uL (ref 4.22–5.81)
RDW: 13.1 % (ref 11.5–15.5)
WBC: 14.6 10*3/uL — ABNORMAL HIGH (ref 4.0–10.5)
nRBC: 0 % (ref 0.0–0.2)

## 2020-04-22 LAB — GLUCOSE, CAPILLARY
Glucose-Capillary: 109 mg/dL — ABNORMAL HIGH (ref 70–99)
Glucose-Capillary: 136 mg/dL — ABNORMAL HIGH (ref 70–99)
Glucose-Capillary: 156 mg/dL — ABNORMAL HIGH (ref 70–99)
Glucose-Capillary: 192 mg/dL — ABNORMAL HIGH (ref 70–99)

## 2020-04-22 LAB — COMPREHENSIVE METABOLIC PANEL
ALT: 125 U/L — ABNORMAL HIGH (ref 0–44)
AST: 66 U/L — ABNORMAL HIGH (ref 15–41)
Albumin: 2.4 g/dL — ABNORMAL LOW (ref 3.5–5.0)
Alkaline Phosphatase: 39 U/L (ref 38–126)
Anion gap: 9 (ref 5–15)
BUN: 21 mg/dL — ABNORMAL HIGH (ref 6–20)
CO2: 22 mmol/L (ref 22–32)
Calcium: 8.5 mg/dL — ABNORMAL LOW (ref 8.9–10.3)
Chloride: 105 mmol/L (ref 98–111)
Creatinine, Ser: 0.73 mg/dL (ref 0.61–1.24)
GFR calc Af Amer: >60 mL/min (ref >60)
GFR calc non Af Amer: >60 mL/min (ref >60)
Glucose, Bld: 147 mg/dL — ABNORMAL HIGH (ref 70–99)
Potassium: 5.3 mmol/L — ABNORMAL HIGH (ref 3.5–5.1)
Sodium: 136 mmol/L (ref 135–145)
Total Bilirubin: 1.7 mg/dL — ABNORMAL HIGH (ref 0.3–1.2)
Total Protein: 6.1 g/dL — ABNORMAL LOW (ref 6.5–8.1)

## 2020-04-22 LAB — PHOSPHORUS: Phosphorus: 4.4 mg/dL (ref 2.5–4.6)

## 2020-04-22 LAB — MAGNESIUM: Magnesium: 2.6 mg/dL — ABNORMAL HIGH (ref 1.7–2.4)

## 2020-04-22 LAB — C-REACTIVE PROTEIN: CRP: 1.3 mg/dL — ABNORMAL HIGH (ref <1.0)

## 2020-04-22 LAB — D-DIMER, QUANTITATIVE: D-Dimer, Quant: 0.66 ug/mL-FEU — ABNORMAL HIGH (ref 0.00–0.50)

## 2020-04-22 MED ORDER — GUAIFENESIN-DM 100-10 MG/5ML PO SYRP
5.0000 mL | ORAL_SOLUTION | ORAL | Status: DC | PRN
  Administered 2020-04-22 – 2020-04-25 (×8): 5 mL via ORAL
  Filled 2020-04-22 (×9): qty 5

## 2020-04-22 NOTE — Progress Notes (Signed)
[facility] PROGRESS NOTE    Progress Note  Maryclare BeanSaul Eynon  NFA:[id number] DOB: [birth year] DOA: 04/18/2020 PCP: Patient, No Pcp Per     Brief Narrative:   Maryclare BeanSaul Sunderlin is an 59 y.o. male past medical history significant recent COVID-19 viral infection tested positive on 04/10/2020 in the outpatient setting presents with 2 days of worsening dyspnea and persistent productive cough.  In the outpatient setting she was started on prednisone 4 days prior to admission but despite this she continued to be short of breath.  Came into the ED was found to be hypoxic, chest x-ray showed bilateral pulmonary infiltrates, elevated inflammatory markers.  Assessment/Plan:   Acute respiratory failure with hypoxia 2/2 to Pneumonia due to COVID-19 virus: Fortunately his oxygen requirements have stabilized, this morning he is on 11 L satting greater 90%. He was started empirically steroids. His inflammatory markers are significantly improved this morning. Try to keep him prone for at least 16 hours a day if not prone out of bed to chair, encourage incentive spirometry and flutter valve. Remdesivir and baricitinib rash. Continue IV Benadryl and steroids.  Hyperglycemia: With an A1c of 6.2, will start on sliding scale insulin.  His blood glucose has been fairly controlled.  Diffuse urticarial rash: Most likely due to medication (baricitinib or remdesivir) will go ahead continue steroids start him on IV Benadryl hold baricitinib and remdesivir.   DVT prophylaxis: lovenxo Family Communication:none Status is: Inpatient  Remains inpatient appropriate because:Hemodynamically unstable   Dispo:  Patient From: Home  Planned Disposition: Home  Expected discharge date: 04/20/20  Medically stable for discharge: No patient is now medically stable for discharge his oxygen requirements are increasing.     Code Status:     Code Status Orders  (From admission, onward)         Start     Ordered   04/19/20  0039  Full code  Continuous        04/19/20 0038        Code Status History    This patient has a current code status but no historical code status.   Advance Care Planning Activity        IV Access:    Peripheral IV   Procedures and diagnostic studies:   No results found.   Medical Consultants:    None.  Anti-Infectives:   Azithromycin 1 day  Subjective:    Maryclare BeanSaul Grussing relates his rash is better than yesterday.  His appetite is improving.  Objective:    Vitals:   04/21/20 1542 04/21/20 1745 04/21/20 2035 04/22/20 0547  BP:   138/82 116/69  Pulse:   71 60  Resp:   (!) 22 18  Temp:   97.6 F (36.4 C) 98 F (36.7 C)  TempSrc:   Oral Axillary  SpO2: 93% 92% (!) 88% 97%  Weight:      Height:       SpO2: 97 % O2 Flow Rate (L/min): 11 L/min FiO2 (%): 100 %   Intake/Output Summary (Last 24 hours) at 04/22/2020 0933 Last data filed at 04/22/2020 [id number] Gross per 24 hour  Intake 650 ml  Output 1105 ml  Net -455 ml   Filed Weights   04/20/20 1350 04/20/20 2316  Weight: 84.4 kg 77.6 kg    Exam: General exam: In no acute distress. Respiratory system: Good air movement with diffuse crackles bilaterally. Cardiovascular system: S1 & S2 heard, RRR. No JVD. Gastrointestinal system: Abdomen is nondistended, soft and nontender.  Extremities: No  pedal edema. Skin: Urticarial rash is improving is receding on his legs.  Still present in his thorax abdomen and back the one on his back appears to be improving. Psychiatry: Judgement and insight appear normal. Mood & affect appropriate.  Data Reviewed:    Labs: Basic Metabolic Panel: Recent Labs  Lab 04/18/20 2025 04/18/20 2025 04/19/20 0500 04/19/20 0500 04/20/20 0529 04/20/20 0529 04/21/20 0801 04/22/20 0502  NA 134*  --  134*  --  135  --  137 136  K 4.5   < > 4.5   < > 4.3   < > 4.4 5.3*  CL 95*  --  99  --  99  --  102 105  CO2 24  --  25  --  26  --  26 22  GLUCOSE 160*  --  151*  --  164*  --   156* 147*  BUN 16  --  18  --  24*  --  21* 21*  CREATININE 0.80  --  0.79  --  0.83  --  0.78 0.73  CALCIUM 8.7*  --  8.3*  --  8.4*  --  8.7* 8.5*  MG  --   --  2.6*  --  2.6*  --  2.5* 2.6*  PHOS  --   --  3.5  --  4.2  --  4.0 4.4   < > = values in this interval not displayed.   GFR Estimated Creatinine Clearance: 91.7 mL/min (by C-G formula based on SCr of 0.73 mg/dL). Liver Function Tests: Recent Labs  Lab 04/18/20 2025 04/19/20 0500 04/20/20 0529 04/21/20 0801 04/22/20 0502  AST 79* 62* 53* 48* 66*  ALT 132* 104* 96* 98* 125*  ALKPHOS 42 35* 36* 36* 39  BILITOT 1.0 1.0 1.1 0.8 1.7*  PROT 7.7 6.4* 6.6 6.6 6.1*  ALBUMIN 3.0* 2.6* 2.4* 2.6* 2.4*   No results for input(s): LIPASE, AMYLASE in the last 168 hours. No results for input(s): AMMONIA in the last 168 hours. Coagulation profile No results for input(s): INR, PROTIME in the last 168 hours. COVID-19 Labs  Recent Labs    04/20/20 0529 04/21/20 0801 04/22/20 0502  DDIMER 0.73* 0.69* 0.66*  FERRITIN 2,237* 1,921* 1,336*  CRP 6.9* 2.8* 1.3*    Lab Results  Component Value Date   SARSCOV2NAA POSITIVE (A) 04/18/2020    CBC: Recent Labs  Lab 04/18/20 2025 04/19/20 0500 04/20/20 0529 04/21/20 0801 04/22/20 0502  WBC 10.4 10.7* 14.1* 15.0* 14.6*  NEUTROABS 8.8* 9.1* 12.1* 12.4* 12.0*  HGB 14.8 13.1 13.1 13.8 13.1  HCT 45.1 40.0 39.6 42.2 40.7  MCV 91.5 90.1 91.5 90.9 92.7  PLT 261 272 350 480* 319   Cardiac Enzymes: No results for input(s): CKTOTAL, CKMB, CKMBINDEX, TROPONINI in the last 168 hours. BNP (last 3 results) No results for input(s): PROBNP in the last 8760 hours. CBG: Recent Labs  Lab 04/21/20 0855 04/21/20 1151 04/21/20 1621 04/21/20 2042 04/22/20 0804  GLUCAP 156* 153* 187* 182* 109*   D-Dimer: Recent Labs    04/21/20 0801 04/22/20 0502  DDIMER 0.69* 0.66*   Hgb A1c: No results for input(s): HGBA1C in the last 72 hours. Lipid Profile: No results for input(s): CHOL, HDL,  LDLCALC, TRIG, CHOLHDL, LDLDIRECT in the last 72 hours. Thyroid function studies: No results for input(s): TSH, T4TOTAL, T3FREE, THYROIDAB in the last 72 hours.  Invalid input(s): FREET3 Anemia work up: Recent Labs    04/21/20 0801 04/22/20 0502  FERRITIN  1,921* 1,336*   Sepsis Labs: Recent Labs  Lab 04/18/20 2025 04/18/20 2025 04/18/20 2140 04/19/20 0500 04/20/20 0529 04/21/20 0801 04/22/20 0502  PROCALCITON <0.10  --   --   --   --   --   --   WBC 10.4   < >  --  10.7* 14.1* 15.0* 14.6*  LATICACIDVEN 2.6*  --  2.2*  --   --   --   --    < > = values in this interval not displayed.   Microbiology Recent Results (from the past 240 hour(s))  Blood Culture (routine x 2)     Status: None (Preliminary result)   Collection Time: 04/18/20  8:15 PM   Specimen: BLOOD RIGHT ARM  Result Value Ref Range Status   Specimen Description BLOOD RIGHT ARM  Final   Special Requests   Final    BOTTLES DRAWN AEROBIC AND ANAEROBIC Blood Culture adequate volume   Culture   Final    NO GROWTH 3 DAYS Performed at [facility] Lab, [address], [place], [place] [address]    Report Status PENDING  Incomplete  Blood Culture (routine x 2)     Status: None (Preliminary result)   Collection Time: 04/18/20  8:28 PM   Specimen: BLOOD RIGHT HAND  Result Value Ref Range Status   Specimen Description BLOOD RIGHT HAND  Final   Special Requests   Final    BOTTLES DRAWN AEROBIC ONLY Blood Culture adequate volume   Culture   Final    NO GROWTH 3 DAYS Performed at [facility], [address], [place], [place] [address]    Report Status PENDING  Incomplete  SARS Coronavirus 2 by RT PCR (hospital order, performed in [facility] hospital lab) Nasopharyngeal Nasopharyngeal Swab     Status: Abnormal   Collection Time: 04/18/20 10:18 PM   Specimen: Nasopharyngeal Swab  Result Value Ref Range Status   SARS Coronavirus 2 POSITIVE (A) NEGATIVE Final    Comment: RESULT CALLED TO, READ BACK BY AND  VERIFIED WITH: J,FAWATZKI @0023  04/19/20 EB (NOTE) SARS-CoV-2 target nucleic acids are DETECTED  SARS-CoV-2 RNA is generally detectable in upper respiratory specimens  during the acute phase of infection.  Positive results are indicative  of the presence of the identified virus, but do not rule out bacterial infection or co-infection with other pathogens not detected by the test.  Clinical correlation with patient history and  other diagnostic information is necessary to determine patient infection status.  The expected result is negative.  Fact Sheet for Patients:   06/19/20   Fact Sheet for Healthcare Providers:   BoilerBrush.com.cy    This test is not yet approved or cleared by the https://pope.com/ FDA and  has been authorized for detection and/or diagnosis of SARS-CoV-2 by FDA under an Emergency Use Authorization (EUA).  This EUA will remain in effect (meaning this test can  be used) for the duration of  the COVID-19 declaration under Section 564(b)(1) of the Act, 21 U.S.C. section 360-bbb-3(b)(1), unless the authorization is terminated or revoked sooner.  Performed at [facility], [address], [place], [place]      Medications:   . albuterol  2 puff Inhalation Q6H  . vitamin C  500 mg Oral Daily  . cholecalciferol  1,000 Units Oral Daily  . diphenhydrAMINE  12.5 mg Intravenous Q6H  . enoxaparin (LOVENOX) injection  40 mg Subcutaneous Daily  . insulin aspart  0-5  Units Subcutaneous QHS  . insulin aspart  0-9 Units Subcutaneous TID WC  . insulin aspart  3 Units Subcutaneous TID WC  . ipratropium  2 puff Inhalation Q6H  . methylPREDNISolone (SOLU-MEDROL) injection  80 mg Intravenous Q12H  . multivitamin with minerals  1 tablet Oral Daily  . zinc sulfate  220 mg Oral Daily   Continuous Infusions:     LOS: 4 days   Marinda Elk  [facility]  04/22/2020, 9:33 AM

## 2020-04-23 LAB — COMPREHENSIVE METABOLIC PANEL
ALT: 122 U/L — ABNORMAL HIGH (ref 0–44)
AST: 45 U/L — ABNORMAL HIGH (ref 15–41)
Albumin: 2.5 g/dL — ABNORMAL LOW (ref 3.5–5.0)
Alkaline Phosphatase: 35 U/L — ABNORMAL LOW (ref 38–126)
Anion gap: 8 (ref 5–15)
BUN: 19 mg/dL (ref 6–20)
CO2: 26 mmol/L (ref 22–32)
Calcium: 8.4 mg/dL — ABNORMAL LOW (ref 8.9–10.3)
Chloride: 102 mmol/L (ref 98–111)
Creatinine, Ser: 0.77 mg/dL (ref 0.61–1.24)
GFR calc Af Amer: >60 mL/min (ref >60)
GFR calc non Af Amer: >60 mL/min (ref >60)
Glucose, Bld: 154 mg/dL — ABNORMAL HIGH (ref 70–99)
Potassium: 4.6 mmol/L (ref 3.5–5.1)
Sodium: 136 mmol/L (ref 135–145)
Total Bilirubin: 1 mg/dL (ref 0.3–1.2)
Total Protein: 6.2 g/dL — ABNORMAL LOW (ref 6.5–8.1)

## 2020-04-23 LAB — CBC WITH DIFFERENTIAL/PLATELET
Abs Immature Granulocytes: 1.1 10*3/uL — ABNORMAL HIGH (ref 0.00–0.07)
Basophils Absolute: 0.1 10*3/uL (ref 0.0–0.1)
Basophils Relative: 1 %
Eosinophils Absolute: 0 10*3/uL (ref 0.0–0.5)
Eosinophils Relative: 0 %
HCT: 40 % (ref 39.0–52.0)
Hemoglobin: 13.2 g/dL (ref 13.0–17.0)
Immature Granulocytes: 8 %
Lymphocytes Relative: 8 %
Lymphs Abs: 1.1 10*3/uL (ref 0.7–4.0)
MCH: 30.1 pg (ref 26.0–34.0)
MCHC: 33 g/dL (ref 30.0–36.0)
MCV: 91.1 fL (ref 80.0–100.0)
Monocytes Absolute: 0.7 10*3/uL (ref 0.1–1.0)
Monocytes Relative: 5 %
Neutro Abs: 11 10*3/uL — ABNORMAL HIGH (ref 1.7–7.7)
Neutrophils Relative %: 78 %
Platelets: 547 10*3/uL — ABNORMAL HIGH (ref 150–400)
RBC: 4.39 MIL/uL (ref 4.22–5.81)
RDW: 12.8 % (ref 11.5–15.5)
WBC: 13.9 10*3/uL — ABNORMAL HIGH (ref 4.0–10.5)
nRBC: 0 % (ref 0.0–0.2)

## 2020-04-23 LAB — MAGNESIUM: Magnesium: 2.5 mg/dL — ABNORMAL HIGH (ref 1.7–2.4)

## 2020-04-23 LAB — CULTURE, BLOOD (ROUTINE X 2)
Culture: NO GROWTH
Culture: NO GROWTH
Special Requests: ADEQUATE
Special Requests: ADEQUATE

## 2020-04-23 LAB — FERRITIN: Ferritin: 924 ng/mL — ABNORMAL HIGH (ref 24–336)

## 2020-04-23 LAB — GLUCOSE, CAPILLARY
Glucose-Capillary: 123 mg/dL — ABNORMAL HIGH (ref 70–99)
Glucose-Capillary: 154 mg/dL — ABNORMAL HIGH (ref 70–99)
Glucose-Capillary: 189 mg/dL — ABNORMAL HIGH (ref 70–99)
Glucose-Capillary: 173 mg/dL — ABNORMAL HIGH (ref 70–99)

## 2020-04-23 LAB — C-REACTIVE PROTEIN: CRP: 2.3 mg/dL — ABNORMAL HIGH (ref <1.0)

## 2020-04-23 LAB — PHOSPHORUS: Phosphorus: 3.9 mg/dL (ref 2.5–4.6)

## 2020-04-23 LAB — D-DIMER, QUANTITATIVE: D-Dimer, Quant: 0.45 ug/mL-FEU (ref 0.00–0.50)

## 2020-04-23 MED ORDER — DIPHENHYDRAMINE HCL 50 MG/ML IJ SOLN: 12.5000 mg | Freq: Four times a day (QID) | INTRAMUSCULAR | Status: DC | PRN

## 2020-04-23 NOTE — Plan of Care (Signed)

## 2020-04-23 NOTE — Progress Notes (Signed)
Rash has resolved. Ok to change scheduled diphenhydramine to 12.5mg  IV q6 PRN per Dr. David Stall.  Ulyses Southward, PharmD, BCIDP, AAHIVP, CPP Infectious Disease Pharmacist 04/23/2020 11:22 AM

## 2020-04-23 NOTE — TOC Initial Note (Signed)
Transition of Care [facility]) - Initial/Assessment Note    Patient Details  Name: Brett Smith MRN: [id number] Date of Birth: 1961/03/07  Transition of [facility]) CM/SW Contact:    Lockie Pares, RN Phone Number: 04/23/2020, 9:48 AM  Clinical Narrative:                  COVID admission day 4 . Still on quite a bit of oxygen 11LPM HFNC to maintain oxygen saturations. Will likely need oxygen post hospitalization. CM will follow for needs   Expected Discharge Plan: Home/Self Care Barriers to Discharge: Continued Medical Work up   Patient Goals and CMS Choice        Expected Discharge Plan and Services Expected Discharge Plan: Home/Self Care   Discharge Planning Services: CM Consult   Living arrangements for the past 2 months: Single Family Home                                      Prior Living Arrangements/Services Living arrangements for the past 2 months: Single Family Home Lives with:: Spouse Patient language and need for interpreter reviewed:: Yes        Need for Family Participation in Patient Care: Yes (Comment) Care giver support system in place?: Yes (comment)   Criminal Activity/Legal Involvement Pertinent to Current Situation/Hospitalization: No - Comment as needed  Activities of Daily Living Home Assistive Devices/Equipment: None ADL Screening (condition at time of admission) Patient's cognitive ability adequate to safely complete daily activities?: Yes Is the patient deaf or have difficulty hearing?: No Does the patient have difficulty seeing, even when wearing glasses/contacts?: No Does the patient have difficulty concentrating, remembering, or making decisions?: No Patient able to express need for assistance with ADLs?: Yes Does the patient have difficulty dressing or bathing?: No Independently performs ADLs?: Yes (appropriate for developmental age) Does the patient have difficulty walking or climbing stairs?: No Weakness of Legs: None Weakness  of Arms/Hands: None  Permission Sought/Granted                  Emotional Assessment       Orientation: : Oriented to Self, Oriented to Place, Oriented to  Time, Oriented to Situation Alcohol / Substance Use: Not Applicable Psych Involvement: No (comment)  Admission diagnosis:  Acute hypoxemic respiratory failure due to COVID-19 (HCC) [U07.1, J96.01] Pneumonia due to COVID-19 virus [U07.1, J12.82] Patient Active Problem List   Diagnosis Date Noted  . Pneumonia due to COVID-19 virus 04/19/2020  . Hyperglycemia 04/19/2020  . Leukocytosis 04/19/2020  . Acute respiratory failure with hypoxia (HCC) 04/18/2020   PCP:  Patient, No Pcp Per Pharmacy:   [id number] - [place] ([place]), [place] - [address]  ([place]) [place] [address] Phone: [phone] Fax: [phone]     Social Determinants of Health (SDOH) Interventions    Readmission Risk Interventions No flowsheet data found.

## 2020-04-23 NOTE — Progress Notes (Signed)
[facility] PROGRESS NOTE    Progress Note  Brett Smith  TTS:[id number] DOB: 09-Mar-1961 DOA: 04/18/2020 PCP: Patient, No Pcp Per     Brief Narrative:   Brett Smith is an 59 y.o. male past medical history significant recent COVID-19 viral infection tested positive on 04/10/2020 in the outpatient setting presents with 2 days of worsening dyspnea and persistent productive cough.  In the outpatient setting she was started on prednisone 4 days prior to admission but despite this she continued to be short of breath.  Came into the ED was found to be hypoxic, chest x-ray showed bilateral pulmonary infiltrates, elevated inflammatory markers.  Assessment/Plan:   Acute respiratory failure with hypoxia 2/2 to Pneumonia due to COVID-19 virus: His oxygen requirements have stabilized he is now requiring 11 L of oxygen to keep saturations greater than 90%. Remdesivir and Versed and it had to be stopped as he was developing allergic cardial rash. Continue him on IV steroids. Try to prone the patient currently 16 hours and not prone out of bed to chair, correction to spirometry and flutter valve.  Hyperglycemia: With an A1c of 6.2, blood glucose fairly controlled under sliding scale continue current regimen.  Diffuse urticarial rash: Resolved. Most likely due to medication (baricitinib or remdesivir) will go ahead continue steroids start him on IV Benadryl hold baricitinib and remdesivir.   DVT prophylaxis: lovenxo Family Communication:none Status is: Inpatient  Remains inpatient appropriate because:Hemodynamically unstable   Dispo:  Patient From: Home  Planned Disposition: Home  Expected discharge date: 04/24/20  Medically stable for discharge: No patient is now medically stable for discharge his oxygen requirements are increasing.     Code Status:     Code Status Orders  (From admission, onward)         Start     Ordered   04/19/20 0039  Full code  Continuous        04/19/20  0038        Code Status History    This patient has a current code status but no historical code status.   Advance Care Planning Activity        IV Access:    Peripheral IV   Procedures and diagnostic studies:   No results found.   Medical Consultants:    None.  Anti-Infectives:   Azithromycin 1 day  Subjective:    Brett Smith relates that his appetite is significantly improved.  Objective:    Vitals:   04/22/20 1344 04/22/20 2010 04/23/20 0630 04/23/20 0631  BP: 104/71 117/75  114/69  Pulse: 77 74 61 (!) 54  Resp: (!) 23 (!) 24  20  Temp: 98 F (36.7 C) 98.4 F (36.9 C)  98.2 F (36.8 C)  TempSrc: Oral Oral  Oral  SpO2: 92% 91%  94%  Weight:      Height:       SpO2: 94 % O2 Flow Rate (L/min): 11 L/min FiO2 (%): 100 %   Intake/Output Summary (Last 24 hours) at 04/23/2020 0813 Last data filed at 04/22/2020 2011 Gross per 24 hour  Intake 640 ml  Output 1700 ml  Net -1060 ml   Filed Weights   04/20/20 1350 04/20/20 2316  Weight: 84.4 kg 77.6 kg    Exam: General exam: In no acute distress. Respiratory system: Good air movement with diffuse crackles bilaterally Cardiovascular system: S1 & S2 heard, RRR. No JVD. Gastrointestinal system: Abdomen is nondistended, soft and nontender.  Extremities: No pedal edema. Skin: Urticarial rash has  resolved Psychiatry: Judgement and insight appear normal. Mood & affect appropriate.  Data Reviewed:    Labs: Basic Metabolic Panel: Recent Labs  Lab 04/19/20 0500 04/19/20 0500 04/20/20 0529 04/20/20 0529 04/21/20 0801 04/21/20 0801 04/22/20 0502 04/23/20 0545  NA 134*  --  135  --  137  --  136 136  K 4.5   < > 4.3   < > 4.4   < > 5.3* 4.6  CL 99  --  99  --  102  --  105 102  CO2 25  --  26  --  26  --  22 26  GLUCOSE 151*  --  164*  --  156*  --  147* 154*  BUN 18  --  24*  --  21*  --  21* 19  CREATININE 0.79  --  0.83  --  0.78  --  0.73 0.77  CALCIUM 8.3*  --  8.4*  --  8.7*  --  8.5*  8.4*  MG 2.6*  --  2.6*  --  2.5*  --  2.6* 2.5*  PHOS 3.5  --  4.2  --  4.0  --  4.4 3.9   < > = values in this interval not displayed.   GFR Estimated Creatinine Clearance: 91.7 mL/min (by C-G formula based on SCr of 0.77 mg/dL). Liver Function Tests: Recent Labs  Lab 04/19/20 0500 04/20/20 0529 04/21/20 0801 04/22/20 0502 04/23/20 0545  AST 62* 53* 48* 66* 45*  ALT 104* 96* 98* 125* 122*  ALKPHOS 35* 36* 36* 39 35*  BILITOT 1.0 1.1 0.8 1.7* 1.0  PROT 6.4* 6.6 6.6 6.1* 6.2*  ALBUMIN 2.6* 2.4* 2.6* 2.4* 2.5*   No results for input(s): LIPASE, AMYLASE in the last 168 hours. No results for input(s): AMMONIA in the last 168 hours. Coagulation profile No results for input(s): INR, PROTIME in the last 168 hours. COVID-19 Labs  Recent Labs    04/21/20 0801 04/22/20 0502 04/23/20 0545  DDIMER 0.69* 0.66* 0.45  FERRITIN 1,921* 1,336* 924*  CRP 2.8* 1.3* 2.3*    Lab Results  Component Value Date   SARSCOV2NAA POSITIVE (A) 04/18/2020    CBC: Recent Labs  Lab 04/19/20 0500 04/20/20 0529 04/21/20 0801 04/22/20 0502 04/23/20 0545  WBC 10.7* 14.1* 15.0* 14.6* 13.9*  NEUTROABS 9.1* 12.1* 12.4* 12.0* PENDING  HGB 13.1 13.1 13.8 13.1 13.2  HCT 40.0 39.6 42.2 40.7 40.0  MCV 90.1 91.5 90.9 92.7 91.1  PLT 272 350 480* 319 547*   Cardiac Enzymes: No results for input(s): CKTOTAL, CKMB, CKMBINDEX, TROPONINI in the last 168 hours. BNP (last 3 results) No results for input(s): PROBNP in the last 8760 hours. CBG: Recent Labs  Lab 04/22/20 0804 04/22/20 1158 04/22/20 1641 04/22/20 2133 04/23/20 0753  GLUCAP 109* 136* 156* 192* 123*   D-Dimer: Recent Labs    04/22/20 0502 04/23/20 0545  DDIMER 0.66* 0.45   Hgb A1c: No results for input(s): HGBA1C in the last 72 hours. Lipid Profile: No results for input(s): CHOL, HDL, LDLCALC, TRIG, CHOLHDL, LDLDIRECT in the last 72 hours. Thyroid function studies: No results for input(s): TSH, T4TOTAL, T3FREE, THYROIDAB in  the last 72 hours.  Invalid input(s): FREET3 Anemia work up: Recent Labs    04/22/20 0502 04/23/20 0545  FERRITIN 1,336* 924*   Sepsis Labs: Recent Labs  Lab 04/18/20 2025 04/18/20 2140 04/19/20 0500 04/20/20 0529 04/21/20 0801 04/22/20 0502 04/23/20 0545  PROCALCITON <0.10  --   --   --   --   --   --  WBC 10.4  --    < > 14.1* 15.0* 14.6* 13.9*  LATICACIDVEN 2.6* 2.2*  --   --   --   --   --    < > = values in this interval not displayed.   Microbiology Recent Results (from the past 240 hour(s))  Blood Culture (routine x 2)     Status: None   Collection Time: 04/18/20  8:15 PM   Specimen: BLOOD RIGHT ARM  Result Value Ref Range Status   Specimen Description BLOOD RIGHT ARM  Final   Special Requests   Final    BOTTLES DRAWN AEROBIC AND ANAEROBIC Blood Culture adequate volume   Culture   Final    NO GROWTH 5 DAYS Performed at [facility], [address] [place], [place] [address]    Report Status 04/23/2020 FINAL  Final  Blood Culture (routine x 2)     Status: None   Collection Time: 04/18/20  8:28 PM   Specimen: BLOOD RIGHT HAND  Result Value Ref Range Status   Specimen Description BLOOD RIGHT HAND  Final   Special Requests   Final    BOTTLES DRAWN AEROBIC ONLY Blood Culture adequate volume   Culture   Final    NO GROWTH 5 DAYS Performed at [facility], [address] [place], [place] [address]    Report Status 04/23/2020 FINAL  Final  SARS Coronavirus 2 by RT PCR (hospital order, performed in [facility] hospital lab) Nasopharyngeal Nasopharyngeal Swab     Status: Abnormal   Collection Time: 04/18/20 10:18 PM   Specimen: Nasopharyngeal Swab  Result Value Ref Range Status   SARS Coronavirus 2 POSITIVE (A) NEGATIVE Final    Comment: RESULT CALLED TO, READ BACK BY AND VERIFIED WITH: J,FAWATZKI @0023  04/19/20 EB (NOTE) SARS-CoV-2 target nucleic acids are DETECTED  SARS-CoV-2 RNA is generally detectable in upper respiratory specimens  during  the acute phase of infection.  Positive results are indicative  of the presence of the identified virus, but do not rule out bacterial infection or co-infection with other pathogens not detected by the test.  Clinical correlation with patient history and  other diagnostic information is necessary to determine patient infection status.  The expected result is negative.  Fact Sheet for Patients:   BoilerBrush.com.cyhttps://www.fda.gov/media/136312/download   Fact Sheet for Healthcare Providers:   https://pope.com/https://www.fda.gov/media/136313/download    This test is not yet approved or cleared by the [place] FDA and  has been authorized for detection and/or diagnosis of SARS-CoV-2 by FDA under an Emergency Use Authorization (EUA).  This EUA will remain in effect (meaning this test can  be used) for the duration of  the COVID-19 declaration under Section 564(b)(1) of the Act, 21 U.S.C. section 360-bbb-3(b)(1), unless the authorization is terminated or revoked sooner.  Performed at [facility], [address] [place], [place] [address]      Medications:   . albuterol  2 puff Inhalation Q6H  . vitamin C  500 mg Oral Daily  . cholecalciferol  1,000 Units Oral Daily  . diphenhydrAMINE  12.5 mg Intravenous Q6H  . enoxaparin (LOVENOX) injection  40 mg Subcutaneous Daily  . insulin aspart  0-5 Units Subcutaneous QHS  . insulin aspart  0-9 Units Subcutaneous TID WC  . insulin aspart  3 Units Subcutaneous TID WC  . ipratropium  2 puff Inhalation Q6H  . methylPREDNISolone (SOLU-MEDROL) injection  80 mg Intravenous Q12H  . multivitamin with minerals  1  tablet Oral Daily  . zinc sulfate  220 mg Oral Daily   Continuous Infusions:     LOS: 5 days   Marinda Elk  [facility]  04/23/2020, 8:13 AM

## 2020-04-24 LAB — GLUCOSE, CAPILLARY
Glucose-Capillary: 128 mg/dL — ABNORMAL HIGH (ref 70–99)
Glucose-Capillary: 205 mg/dL — ABNORMAL HIGH (ref 70–99)
Glucose-Capillary: 286 mg/dL — ABNORMAL HIGH (ref 70–99)
Glucose-Capillary: 169 mg/dL — ABNORMAL HIGH (ref 70–99)

## 2020-04-24 MED ORDER — POTASSIUM CHLORIDE CRYS ER 20 MEQ PO TBCR
20.0000 meq | EXTENDED_RELEASE_TABLET | Freq: Two times a day (BID) | ORAL | Status: AC
  Administered 2020-04-24 (×2): 20 meq via ORAL
  Filled 2020-04-24 (×2): qty 1

## 2020-04-24 MED ORDER — PREDNISONE 20 MG PO TABS
50.0000 mg | ORAL_TABLET | Freq: Every day | ORAL | Status: DC
  Administered 2020-04-25 – 2020-04-26 (×2): 50 mg via ORAL
  Filled 2020-04-24 (×2): qty 2

## 2020-04-24 MED ORDER — FUROSEMIDE 10 MG/ML IJ SOLN
20.0000 mg | Freq: Once | INTRAMUSCULAR | Status: AC
  Administered 2020-04-24: 20 mg via INTRAVENOUS
  Filled 2020-04-24: qty 2

## 2020-04-24 NOTE — Progress Notes (Signed)
[facility] PROGRESS NOTE    Progress Note  Brett Smith  [id number]:[id number] DOB: 07-Mar-1961 DOA: 04/18/2020 PCP: Patient, No Pcp Per     Brief Narrative:   Brett Smith is an 59 y.o. male past medical history significant recent COVID-19 viral infection tested positive on 04/10/2020 in the outpatient setting presents with 2 days of worsening dyspnea and persistent productive cough.  In the outpatient setting she was started on prednisone 4 days prior to admission but despite this she continued to be short of breath.  Came into the ED was found to be hypoxic, chest x-ray showed bilateral pulmonary infiltrates, elevated inflammatory markers.  Assessment/Plan:   Acute respiratory failure with hypoxia 2/2 to Pneumonia due to COVID-19 virus: Continue to wean him down on his oxygen, his respiratory status is improving he is now requiring 5 L oxygen to keep saturations greater 90%. His inflammatory markers are improving we will stop checking. Remdesivir and baricitinib had to be stopped due to an allergic urticarial rash We will continue him on steroids for 10 day, will transition him to oral steroids today. Keep the patient prone for at least 16 hours a day if not prone out of bed to chair, continue incentive spirometry and flutter valve.  Hyperglycemia: With an A1c of 6.2, blood glucose fairly controlled under sliding scale continue current regimen.  Diffuse urticarial rash: Resolved. Most likely due to medication (baricitinib or remdesivir) will go ahead continue steroids start him on IV Benadryl hold baricitinib and remdesivir.   DVT prophylaxis: lovenxo Family Communication:none Status is: Inpatient  Remains inpatient appropriate because:Hemodynamically unstable   Dispo:  Patient From: Home  Planned Disposition: Home  Expected discharge date: 04/27/20  Medically stable for discharge: No patient is now medically stable for discharge his oxygen requirements are increasing.     Code  Status:     Code Status Orders  (From admission, onward)         Start     Ordered   04/19/20 0039  Full code  Continuous        04/19/20 0038        Code Status History    This patient has a current code status but no historical code status.   Advance Care Planning Activity        IV Access:    Peripheral IV   Procedures and diagnostic studies:   No results found.   Medical Consultants:    None.  Anti-Infectives:   Azithromycin 1 day  Subjective:    Brett Smith relates his breathing status has improved he feels more upbeat this morning.  Objective:    Vitals:   04/23/20 1700 04/23/20 1951 04/24/20 0247 04/24/20 0549  BP:  131/78  (!) 146/81  Pulse:  79 (!) 52 60  Resp:  18 17 18   Temp:  97.8 F (36.6 C)  97.9 F (36.6 C)  TempSrc:  Oral  Axillary  SpO2: 91% 91% 96% 96%  Weight:      Height:       SpO2: 96 % O2 Flow Rate (L/min): 5 L/min FiO2 (%): 100 %   Intake/Output Summary (Last 24 hours) at 04/24/2020 0936 Last data filed at 04/24/2020 0904 Gross per 24 hour  Intake 700 ml  Output 3157 ml  Net -2457 ml   Filed Weights   04/20/20 1350 04/20/20 2316  Weight: 84.4 kg 77.6 kg    Exam: General exam: In no acute distress. Respiratory system: Good air movement with diffuse crackles  bilaterally. Cardiovascular system: S1 & S2 heard, RRR. No JVD. Gastrointestinal system: Abdomen is nondistended, soft and nontender.  Extremities: No pedal edema. Skin: No rashes, lesions or ulcers  Data Reviewed:    Labs: Basic Metabolic Panel: Recent Labs  Lab 04/19/20 0500 04/19/20 0500 04/20/20 0529 04/20/20 0529 04/21/20 0801 04/21/20 0801 04/22/20 0502 04/23/20 0545  NA 134*  --  135  --  137  --  136 136  K 4.5   < > 4.3   < > 4.4   < > 5.3* 4.6  CL 99  --  99  --  102  --  105 102  CO2 25  --  26  --  26  --  22 26  GLUCOSE 151*  --  164*  --  156*  --  147* 154*  BUN 18  --  24*  --  21*  --  21* 19  CREATININE 0.79  --  0.83   --  0.78  --  0.73 0.77  CALCIUM 8.3*  --  8.4*  --  8.7*  --  8.5* 8.4*  MG 2.6*  --  2.6*  --  2.5*  --  2.6* 2.5*  PHOS 3.5  --  4.2  --  4.0  --  4.4 3.9   < > = values in this interval not displayed.   GFR Estimated Creatinine Clearance: 91.7 mL/min (by C-G formula based on SCr of 0.77 mg/dL). Liver Function Tests: Recent Labs  Lab 04/19/20 0500 04/20/20 0529 04/21/20 0801 04/22/20 0502 04/23/20 0545  AST 62* 53* 48* 66* 45*  ALT 104* 96* 98* 125* 122*  ALKPHOS 35* 36* 36* 39 35*  BILITOT 1.0 1.1 0.8 1.7* 1.0  PROT 6.4* 6.6 6.6 6.1* 6.2*  ALBUMIN 2.6* 2.4* 2.6* 2.4* 2.5*   No results for input(s): LIPASE, AMYLASE in the last 168 hours. No results for input(s): AMMONIA in the last 168 hours. Coagulation profile No results for input(s): INR, PROTIME in the last 168 hours. COVID-19 Labs  Recent Labs    04/22/20 0502 04/23/20 0545  DDIMER 0.66* 0.45  FERRITIN 1,336* 924*  CRP 1.3* 2.3*    Lab Results  Component Value Date   SARSCOV2NAA POSITIVE (A) 04/18/2020    CBC: Recent Labs  Lab 04/19/20 0500 04/20/20 0529 04/21/20 0801 04/22/20 0502 04/23/20 0545  WBC 10.7* 14.1* 15.0* 14.6* 13.9*  NEUTROABS 9.1* 12.1* 12.4* 12.0* 11.0*  HGB 13.1 13.1 13.8 13.1 13.2  HCT 40.0 39.6 42.2 40.7 40.0  MCV 90.1 91.5 90.9 92.7 91.1  PLT 272 350 480* 319 547*   Cardiac Enzymes: No results for input(s): CKTOTAL, CKMB, CKMBINDEX, TROPONINI in the last 168 hours. BNP (last 3 results) No results for input(s): PROBNP in the last 8760 hours. CBG: Recent Labs  Lab 04/23/20 0753 04/23/20 1200 04/23/20 1611 04/23/20 2057 04/24/20 0744  GLUCAP 123* 154* 189* 173* 128*   D-Dimer: Recent Labs    04/22/20 0502 04/23/20 0545  DDIMER 0.66* 0.45   Hgb A1c: No results for input(s): HGBA1C in the last 72 hours. Lipid Profile: No results for input(s): CHOL, HDL, LDLCALC, TRIG, CHOLHDL, LDLDIRECT in the last 72 hours. Thyroid function studies: No results for input(s):  TSH, T4TOTAL, T3FREE, THYROIDAB in the last 72 hours.  Invalid input(s): FREET3 Anemia work up: Recent Labs    04/22/20 0502 04/23/20 0545  FERRITIN 1,336* 924*   Sepsis Labs: Recent Labs  Lab 04/18/20 2025 04/18/20 2140 04/19/20 0500 04/20/20 0529 04/21/20 0801  04/22/20 0502 04/23/20 0545  PROCALCITON <0.10  --   --   --   --   --   --   WBC 10.4  --    < > 14.1* 15.0* 14.6* 13.9*  LATICACIDVEN 2.6* 2.2*  --   --   --   --   --    < > = values in this interval not displayed.   Microbiology Recent Results (from the past 240 hour(s))  Blood Culture (routine x 2)     Status: None   Collection Time: 04/18/20  8:15 PM   Specimen: BLOOD RIGHT ARM  Result Value Ref Range Status   Specimen Description BLOOD RIGHT ARM  Final   Special Requests   Final    BOTTLES DRAWN AEROBIC AND ANAEROBIC Blood Culture adequate volume   Culture   Final    NO GROWTH 5 DAYS Performed at [facility], [address], [place], [place] [address]    Report Status 04/23/2020 FINAL  Final  Blood Culture (routine x 2)     Status: None   Collection Time: 04/18/20  8:28 PM   Specimen: BLOOD RIGHT HAND  Result Value Ref Range Status   Specimen Description BLOOD RIGHT HAND  Final   Special Requests   Final    BOTTLES DRAWN AEROBIC ONLY Blood Culture adequate volume   Culture   Final    NO GROWTH 5 DAYS Performed at [facility], [address] [place], [place] [address]    Report Status 04/23/2020 FINAL  Final  SARS Coronavirus 2 by RT PCR (hospital order, performed in [facility] hospital lab) Nasopharyngeal Nasopharyngeal Swab     Status: Abnormal   Collection Time: 04/18/20 10:18 PM   Specimen: Nasopharyngeal Swab  Result Value Ref Range Status   SARS Coronavirus 2 POSITIVE (A) NEGATIVE Final    Comment: RESULT CALLED TO, READ BACK BY AND VERIFIED WITH: J,FAWATZKI @0023  04/19/20 EB (NOTE) SARS-CoV-2 target nucleic acids are DETECTED  SARS-CoV-2 RNA is generally detectable in  upper respiratory specimens  during the acute phase of infection.  Positive results are indicative  of the presence of the identified virus, but do not rule out bacterial infection or co-infection with other pathogens not detected by the test.  Clinical correlation with patient history and  other diagnostic information is necessary to determine patient infection status.  The expected result is negative.  Fact Sheet for Patients:   06/19/20   Fact Sheet for Healthcare Providers:   BoilerBrush.com.cy    This test is not yet approved or cleared by the https://pope.com/ FDA and  has been authorized for detection and/or diagnosis of SARS-CoV-2 by FDA under an Emergency Use Authorization (EUA).  This EUA will remain in effect (meaning this test can  be used) for the duration of  the COVID-19 declaration under Section 564(b)(1) of the Act, 21 U.S.C. section 360-bbb-3(b)(1), unless the authorization is terminated or revoked sooner.  Performed at [facility], [address] [place], [place]      Medications:   . albuterol  2 puff Inhalation Q6H  . vitamin C  500 mg Oral Daily  . cholecalciferol  1,000 Units Oral Daily  . enoxaparin (LOVENOX) injection  40 mg Subcutaneous Daily  . insulin aspart  0-5 Units Subcutaneous QHS  . insulin aspart  0-9 Units Subcutaneous TID WC  . insulin aspart  3 Units Subcutaneous TID WC  . ipratropium  2 puff Inhalation  Q6H  . methylPREDNISolone (SOLU-MEDROL) injection  80 mg Intravenous Q12H  . multivitamin with minerals  1 tablet Oral Daily  . zinc sulfate  220 mg Oral Daily   Continuous Infusions:     LOS: 6 days   Brett Smith  [facility]  04/24/2020, 9:36 AM

## 2020-04-24 NOTE — Plan of Care (Signed)

## 2020-04-25 LAB — BASIC METABOLIC PANEL
Anion gap: 8 (ref 5–15)
BUN: 20 mg/dL (ref 6–20)
CO2: 25 mmol/L (ref 22–32)
Calcium: 8.6 mg/dL — ABNORMAL LOW (ref 8.9–10.3)
Chloride: 102 mmol/L (ref 98–111)
Creatinine, Ser: 0.79 mg/dL (ref 0.61–1.24)
GFR calc Af Amer: >60 mL/min (ref >60)
GFR calc non Af Amer: >60 mL/min (ref >60)
Glucose, Bld: 83 mg/dL (ref 70–99)
Potassium: 4.4 mmol/L (ref 3.5–5.1)
Sodium: 135 mmol/L (ref 135–145)

## 2020-04-25 LAB — GLUCOSE, CAPILLARY
Glucose-Capillary: 74 mg/dL (ref 70–99)
Glucose-Capillary: 161 mg/dL — ABNORMAL HIGH (ref 70–99)
Glucose-Capillary: 168 mg/dL — ABNORMAL HIGH (ref 70–99)
Glucose-Capillary: 139 mg/dL — ABNORMAL HIGH (ref 70–99)

## 2020-04-25 MED ORDER — DIPHENHYDRAMINE HCL 12.5 MG/5ML PO ELIX
12.5000 mg | ORAL_SOLUTION | Freq: Four times a day (QID) | ORAL | Status: DC | PRN
  Filled 2020-04-25: qty 5

## 2020-04-25 NOTE — Progress Notes (Addendum)
[facility] PROGRESS NOTE    Progress Note  Brett Smith  NLZ:[id number] DOB: [birth year] DOA: 04/18/2020 PCP: Patient, No Pcp Per     Brief Narrative:   Brett Smith is an 59 y.o. male past medical history significant recent COVID-19 viral infection tested positive on 04/10/2020 in the outpatient setting presents with 2 days of worsening dyspnea and persistent productive cough.  In the outpatient setting she was started on prednisone 4 days prior to admission but despite this she continued to be short of breath.  Came into the ED was found to be hypoxic, chest x-ray showed bilateral pulmonary infiltrates, elevated inflammatory markers.  Assessment/Plan:   Acute respiratory failure with hypoxia 2/2 to Pneumonia due to COVID-19 virus: Persistently requiring 5 L of oxygen to keep saturations greater than 90%. His inflammatory markers were unremarkable.   Baricitinib and IV remdesivir had to be stopped due to an allergic urticarial rash. Steroids for total of 10 days. Try to keep the patient prone for at least 16 hours a day, if not prone out of bed to chair, continue to use incentive spirometry and flutter valve. Walking check saturations with ambulation, try to wean to room air.  Hyperglycemia: With an A1c of 6.2, blood glucose fairly controlled under sliding scale continue current regimen.  Diffuse urticarial rash: Resolved. Most likely due to medication (baricitinib or remdesivir) will go ahead continue steroids start him on IV Benadryl hold baricitinib and remdesivir.   DVT prophylaxis: lovenxo Family Communication:none Status is: Inpatient  Remains inpatient appropriate because:Hemodynamically unstable   Dispo:  Patient From: Home  Planned Disposition: Home  Expected discharge date: 04/27/20  Medically stable for discharge: No patient is now medically stable for discharge his oxygen requirements are increasing.     Code Status:     Code Status Orders  (From  admission, onward)         Start     Ordered   04/19/20 0039  Full code  Continuous        04/19/20 0038        Code Status History    This patient has a current code status but no historical code status.   Advance Care Planning Activity        IV Access:    Peripheral IV   Procedures and diagnostic studies:   No results found.   Medical Consultants:    None.  Anti-Infectives:   Azithromycin 1 day  Subjective:    [name] really upbeat today wants to go home.  Objective:    Vitals:   04/24/20 0549 04/24/20 1520 04/24/20 2110 04/25/20 0526  BP: (!) 146/81 121/80 133/85 (!) 99/55  Pulse: 60 78 77 62  Resp: 18 (!) 23 (!) 22 17  Temp: 97.9 F (36.6 C) 97.9 F (36.6 C) 97.8 F (36.6 C) 97.6 F (36.4 C)  TempSrc: Axillary Oral Oral Oral  SpO2: 96% 94% 91% (!) 87%  Weight:      Height:       SpO2: (!) 87 % O2 Flow Rate (L/min): 5 L/min FiO2 (%): 100 %   Intake/Output Summary (Last 24 hours) at 04/25/2020 0817 Last data filed at 04/25/2020 0500 Gross per 24 hour  Intake 1350 ml  Output 3890 ml  Net -2540 ml   Filed Weights   04/20/20 1350 04/20/20 2316  Weight: 84.4 kg 77.6 kg    Exam: General exam: In no acute distress. Respiratory system: Good air movement with minimal crackles. Cardiovascular system: S1 &  S2 heard, RRR. No JVD. Gastrointestinal system: Abdomen is nondistended, soft and nontender.  Extremities: No pedal edema. Skin: No rashes, lesions or ulcers  Data Reviewed:    Labs: Basic Metabolic Panel: Recent Labs  Lab 04/19/20 0500 04/19/20 0500 04/20/20 0529 04/20/20 0529 04/21/20 0801 04/21/20 0801 04/22/20 0502 04/22/20 0502 04/23/20 0545 04/25/20 0603  NA 134*   < > 135  --  137  --  136  --  136 135  K 4.5   < > 4.3   < > 4.4   < > 5.3*   < > 4.6 4.4  CL 99   < > 99  --  102  --  105  --  102 102  CO2 25   < > 26  --  26  --  22  --  26 25  GLUCOSE 151*   < > 164*  --  156*  --  147*  --  154* 83  BUN 18    < > 24*  --  21*  --  21*  --  19 20  CREATININE 0.79   < > 0.83  --  0.78  --  0.73  --  0.77 0.79  CALCIUM 8.3*   < > 8.4*  --  8.7*  --  8.5*  --  8.4* 8.6*  MG 2.6*  --  2.6*  --  2.5*  --  2.6*  --  2.5*  --   PHOS 3.5  --  4.2  --  4.0  --  4.4  --  3.9  --    < > = values in this interval not displayed.   GFR Estimated Creatinine Clearance: 91.7 mL/min (by C-G formula based on SCr of 0.79 mg/dL). Liver Function Tests: Recent Labs  Lab 04/19/20 0500 04/20/20 0529 04/21/20 0801 04/22/20 0502 04/23/20 0545  AST 62* 53* 48* 66* 45*  ALT 104* 96* 98* 125* 122*  ALKPHOS 35* 36* 36* 39 35*  BILITOT 1.0 1.1 0.8 1.7* 1.0  PROT 6.4* 6.6 6.6 6.1* 6.2*  ALBUMIN 2.6* 2.4* 2.6* 2.4* 2.5*   No results for input(s): LIPASE, AMYLASE in the last 168 hours. No results for input(s): AMMONIA in the last 168 hours. Coagulation profile No results for input(s): INR, PROTIME in the last 168 hours. COVID-19 Labs  Recent Labs    04/23/20 0545  DDIMER 0.45  FERRITIN 924*  CRP 2.3*    Lab Results  Component Value Date   SARSCOV2NAA POSITIVE (A) 04/18/2020    CBC: Recent Labs  Lab 04/19/20 0500 04/20/20 0529 04/21/20 0801 04/22/20 0502 04/23/20 0545  WBC 10.7* 14.1* 15.0* 14.6* 13.9*  NEUTROABS 9.1* 12.1* 12.4* 12.0* 11.0*  HGB 13.1 13.1 13.8 13.1 13.2  HCT 40.0 39.6 42.2 40.7 40.0  MCV 90.1 91.5 90.9 92.7 91.1  PLT 272 350 480* 319 547*   Cardiac Enzymes: No results for input(s): CKTOTAL, CKMB, CKMBINDEX, TROPONINI in the last 168 hours. BNP (last 3 results) No results for input(s): PROBNP in the last 8760 hours. CBG: Recent Labs  Lab 04/24/20 0744 04/24/20 1235 04/24/20 1620 04/24/20 2113 04/25/20 0732  GLUCAP 128* 205* 286* 169* 74   D-Dimer: Recent Labs    04/23/20 0545  DDIMER 0.45   Hgb A1c: No results for input(s): HGBA1C in the last 72 hours. Lipid Profile: No results for input(s): CHOL, HDL, LDLCALC, TRIG, CHOLHDL, LDLDIRECT in the last 72  hours. Thyroid function studies: No results for input(s): TSH, T4TOTAL, T3FREE, THYROIDAB  in the last 72 hours.  Invalid input(s): FREET3 Anemia work up: Recent Labs    04/23/20 0545  FERRITIN 924*   Sepsis Labs: Recent Labs  Lab 04/18/20 2025 04/18/20 2140 04/19/20 0500 04/20/20 0529 04/21/20 0801 04/22/20 0502 04/23/20 0545  PROCALCITON <0.10  --   --   --   --   --   --   WBC 10.4  --    < > 14.1* 15.0* 14.6* 13.9*  LATICACIDVEN 2.6* 2.2*  --   --   --   --   --    < > = values in this interval not displayed.   Microbiology Recent Results (from the past 240 hour(s))  Blood Culture (routine x 2)     Status: None   Collection Time: 04/18/20  8:15 PM   Specimen: BLOOD RIGHT ARM  Result Value Ref Range Status   Specimen Description BLOOD RIGHT ARM  Final   Special Requests   Final    BOTTLES DRAWN AEROBIC AND ANAEROBIC Blood Culture adequate volume   Culture   Final    NO GROWTH 5 DAYS Performed at [facility], [address] [place], [place] [address]    Report Status 04/23/2020 FINAL  Final  Blood Culture (routine x 2)     Status: None   Collection Time: 04/18/20  8:28 PM   Specimen: BLOOD RIGHT HAND  Result Value Ref Range Status   Specimen Description BLOOD RIGHT HAND  Final   Special Requests   Final    BOTTLES DRAWN AEROBIC ONLY Blood Culture adequate volume   Culture   Final    NO GROWTH 5 DAYS Performed at [facility], [address] [place], [place] [address]    Report Status 04/23/2020 FINAL  Final  SARS Coronavirus 2 by RT PCR (hospital order, performed in [facility] hospital lab) Nasopharyngeal Nasopharyngeal Swab     Status: Abnormal   Collection Time: 04/18/20 10:18 PM   Specimen: Nasopharyngeal Swab  Result Value Ref Range Status   SARS Coronavirus 2 POSITIVE (A) NEGATIVE Final    Comment: RESULT CALLED TO, READ BACK BY AND VERIFIED WITH: J,FAWATZKI @0023  04/19/20 EB (NOTE) SARS-CoV-2 target nucleic acids are  DETECTED  SARS-CoV-2 RNA is generally detectable in upper respiratory specimens  during the acute phase of infection.  Positive results are indicative  of the presence of the identified virus, but do not rule out bacterial infection or co-infection with other pathogens not detected by the test.  Clinical correlation with patient history and  other diagnostic information is necessary to determine patient infection status.  The expected result is negative.  Fact Sheet for Patients:   06/19/20   Fact Sheet for Healthcare Providers:   BoilerBrush.com.cy    This test is not yet approved or cleared by the https://pope.com/ FDA and  has been authorized for detection and/or diagnosis of SARS-CoV-2 by FDA under an Emergency Use Authorization (EUA).  This EUA will remain in effect (meaning this test can  be used) for the duration of  the COVID-19 declaration under Section 564(b)(1) of the Act, 21 U.S.C. section 360-bbb-3(b)(1), unless the authorization is terminated or revoked sooner.  Performed at [facility], [address] [place], [place]      Medications:   . albuterol  2 puff Inhalation Q6H  . vitamin C  500 mg Oral Daily  . cholecalciferol  1,000 Units Oral Daily  . enoxaparin (LOVENOX) injection  40 mg Subcutaneous Daily  . insulin aspart  0-5 Units Subcutaneous QHS  . insulin aspart  0-9 Units Subcutaneous TID WC  . insulin aspart  3 Units Subcutaneous TID WC  . ipratropium  2 puff Inhalation Q6H  . multivitamin with minerals  1 tablet Oral Daily  . predniSONE  50 mg Oral Q breakfast  . zinc sulfate  220 mg Oral Daily   Continuous Infusions:     LOS: 7 days   Marinda ElkAbraham Feliz Ortiz  [facility]  04/25/2020, 8:17 AM

## 2020-04-25 NOTE — TOC Progression Note (Signed)
Transition of Care [facility]) - Progression Note    Patient Details  Name: Brett Smith MRN: [id number] Date of Birth: [birth year]  [facility]) CM/SW Contact  Lockie Pares, RN Phone Number: 04/25/2020, 10:14 AM  Clinical Narrative:    Still on oxygen at 5L EDD is 04/27/2020. Will need to get qualifications for home oxygen closer to the time of discharge.    Expected Discharge Plan: Home/Self Care Barriers to Discharge: Continued Medical Work up  Expected Discharge Plan and Services Expected Discharge Plan: Home/Self Care   Discharge Planning Services: Follow-up appt scheduled   Living arrangements for the past 2 months: Single Family Home                                       Social Determinants of Health (SDOH) Interventions    Readmission Risk Interventions No flowsheet data found.

## 2020-04-25 NOTE — Progress Notes (Signed)
SATURATION QUALIFICATIONS: (This note is used to comply with regulatory documentation for home oxygen)  Patient Saturations on Room Air at Rest = 90%  Patient Saturations on Room Air while Ambulating = 85%  Patient Saturations on 2Liters of oxygen while Ambulating = 91%  Please briefly explain why patient needs home oxygen:Pt O2 dropped while ambulating

## 2020-04-26 LAB — GLUCOSE, CAPILLARY
Glucose-Capillary: 89 mg/dL (ref 70–99)
Glucose-Capillary: 148 mg/dL — ABNORMAL HIGH (ref 70–99)

## 2020-04-26 MED ORDER — PREDNISONE 10 MG PO TABS: 40.0000 mg | ORAL_TABLET | Freq: Every day | ORAL | 0 refills | Status: AC

## 2020-04-26 NOTE — Progress Notes (Signed)
Brett Smith to be D/C'd home per MD order. Discussed with the patient and all questions fully answered.   VVS, Skin clean, dry and intact without evidence of skin break down, no evidence of skin tears noted.  IV catheter discontinued intact. Site without signs and symptoms of complications. Dressing and pressure applied.  An After Visit Summary was printed and given to the patient.  Patient escorted via WC, and D/C home via private auto.  Brett Smith  04/26/2020 4:22 PM

## 2020-04-26 NOTE — Care Management (Signed)
Asked RN for oxygen qualifiers for home oxygen

## 2020-04-26 NOTE — TOC Transition Note (Signed)
Transition of Care [facility]) - CM/SW Discharge Note   Patient Details  Name: Brett Smith MRN: [id number] Date of Birth: Apr 28, 1961  Transition of Care [facility]) CM/SW Contact:  Lockie Pares, RN Phone Number: 04/26/2020, 11:02 AM   Clinical Narrative:    Oxygen set up for patient via [employer].  Address verified in system. Patient states he has no other needs, no further needs identified   Final next level of care: Home/Self Care Barriers to Discharge: Continued Medical Work up   Patient Goals and CMS Choice        Discharge Placement                       Discharge Plan and Services   Discharge Planning Services: Follow-up appt scheduled            DME Arranged: Oxygen DME Agency: [employer] Date DME Agency Contacted: 04/26/20 Time DME Agency Contacted: 1057 Representative spoke with at DME Agency: Geralyn Corwin            Social Determinants of Health (SDOH) Interventions     Readmission Risk Interventions No flowsheet data found.

## 2020-04-26 NOTE — Discharge Summary (Signed)
Physician Discharge Summary  Brett Smith QQI:[id number] DOB: 08/06/1961 DOA: 04/18/2020  PCP: Patient, No Pcp Per  Admit date: 04/18/2020 Discharge date: 04/26/2020  Admitted From: home Disposition:  Home   Recommendations for Outpatient Follow-up:  1. Follow up with PCP in 1-2 weeks  Home Health: none Equipment/Devices: home O2  Discharge Condition: stable CODE STATUS: Full code Diet recommendation: regular  HPI: Per admitting MD, Brett Smith is a 59 y.o. male with medical history significant for recent COVID-19 viral infection, tested positive on 04/10/2020 in the outpatient setting.  Presented with 2 days of worsening dyspnea and persistent nonproductive cough.  Associated with generalized weakness and fatigue.  Reports recently treated at urgent care for COVID-19 viral pneumonitis with prednisone which he took for 4 days and has 3 days remaining.  In the past 2 days has had increasing dyspnea and worsening cough.  5 days prior to testing positive on 04/10/2020 had diarrhea, vomiting, loss of taste and smell, these symptoms have now resolved.  He has a pulse oximeter at home and today his O2 saturation was below 80% at home.  Due to concern for his hypoxia, he presented to the ED for further evaluation and management of his symptomatology.  [facility] was asked to admit.  Hospital Course / Discharge diagnoses: Acute hypoxic respiratory failure due to pneumonia in the setting of COVID-19-patient was admitted to the hospital, treated with remdesivir, baricitinib as well as steroids.  He did develop an urticarial allergic rash and very significant Remdesivir had to be stopped after a few days.  He eventually improved, was able to be weaned off to 2 L nasal cannula, clinically has returned to baseline, able to ambulate in the room and hallway without difficulties, will be discharged home in stable condition with 2 additional days of prednisone to complete a 10-day course. Prediabetes-A1c 6.2 Diffuse  urticarial rash-resolved, possibly due to baricitinib or Remdesivir  Discharge Instructions   Allergies as of 04/26/2020   No Known Allergies     Medication List    STOP taking these medications   artificial tears Oint ophthalmic ointment Commonly known as: LACRILUBE   predniSONE 10 MG (21) Tbpk tablet Commonly known as: STERAPRED UNI-PAK 21 TAB Replaced by: predniSONE 10 MG tablet     TAKE these medications   albuterol 108 (90 Base) MCG/ACT inhaler Commonly known as: VENTOLIN HFA Inhale 2 puffs into the lungs every 6 (six) hours as needed for shortness of breath.   predniSONE 10 MG tablet Commonly known as: DELTASONE Take 4 tablets (40 mg total) by mouth daily for 2 days. Replaces: predniSONE 10 MG (21) Tbpk tablet            Durable Medical Equipment  (From admission, onward)         Start     Ordered   04/26/20 0856  For home use only DME oxygen  Once       Question Answer Comment  Length of Need 6 Months   Mode or (Route) Nasal cannula   Liters per Minute 2   Frequency Continuous (stationary and portable oxygen unit needed)   Oxygen delivery system Gas      04/26/20 0855          Follow-up Information    [facility]. Go on 05/06/2020.   Why: 0930 am Contact information: [address] [place] [address] [phone]       [facility]. Schedule an appointment as soon  as possible for a visit.   Specialty: Family Medicine Contact information: [address] [place] [address] [phone]              Consultations:  None   Procedures/Studies:  DG Chest Port 1 View  Result Date: 04/18/2020 CLINICAL DATA:  COVID-19 positive 12 days ago, cough, short of breath EXAM: PORTABLE CHEST 1 VIEW COMPARISON:  None. FINDINGS: Single frontal view of the chest demonstrates an unremarkable cardiac silhouette. There is multifocal bilateral ground-glass airspace  disease greatest in the lung bases. No effusion or pneumothorax. No acute bony abnormalities. IMPRESSION: 1. Multifocal bilateral pneumonia compatible with COVID 19. Electronically Signed   By: Sharlet Salina M.D.   On: 04/18/2020 21:11     Subjective: - no chest pain, shortness of breath, no abdominal pain, nausea or vomiting.   Discharge Exam: BP 116/75 (BP Location: Left Arm)   Pulse 75   Temp 97.8 F (36.6 C) (Oral)   Resp 20   Ht 5\' 3"  (1.6 m)   Wt 77.6 kg   SpO2 100%   BMI 30.30 kg/m   General: Pt is alert, awake, not in acute distress Cardiovascular: RRR, S1/S2 +, no rubs, no gallops Respiratory: CTA bilaterally, no wheezing, no rhonchi Abdominal: Soft, NT, ND, bowel sounds + Extremities: no edema, no cyanosis   The results of significant diagnostics from this hospitalization (including imaging, microbiology, ancillary and laboratory) are listed below for reference.     Microbiology: Recent Results (from the past 240 hour(s))  Blood Culture (routine x 2)     Status: None   Collection Time: 04/18/20  8:15 PM   Specimen: BLOOD RIGHT ARM  Result Value Ref Range Status   Specimen Description BLOOD RIGHT ARM  Final   Special Requests   Final    BOTTLES DRAWN AEROBIC AND ANAEROBIC Blood Culture adequate volume   Culture   Final    NO GROWTH 5 DAYS Performed at [facility], [address], [place], [place]    Report Status 04/23/2020 FINAL  Final  Blood Culture (routine x 2)     Status: None   Collection Time: 04/18/20  8:28 PM   Specimen: BLOOD RIGHT HAND  Result Value Ref Range Status   Specimen Description BLOOD RIGHT HAND  Final   Special Requests   Final    BOTTLES DRAWN AEROBIC ONLY Blood Culture adequate volume   Culture   Final    NO GROWTH 5 DAYS Performed at [facility], [address], [place], [place]    Report Status 04/23/2020 FINAL  Final  SARS Coronavirus 2 by RT PCR (hospital order, performed in [facility]  hospital lab) Nasopharyngeal Nasopharyngeal Swab     Status: Abnormal   Collection Time: 04/18/20 10:18 PM   Specimen: Nasopharyngeal Swab  Result Value Ref Range Status   SARS Coronavirus 2 POSITIVE (A) NEGATIVE Final    Comment: RESULT CALLED TO, READ BACK BY AND VERIFIED WITH: [email or web address] @0023  04/19/20 EB (NOTE) SARS-CoV-2 target nucleic acids are DETECTED  SARS-CoV-2 RNA is generally detectable in upper respiratory specimens  during the acute phase of infection.  Positive results are indicative  of the presence of the identified virus, but do not rule out bacterial infection or co-infection with other pathogens not detected by the test.  Clinical correlation with patient history and  other diagnostic information is necessary to determine patient infection status.  The expected result is negative.  Fact  Sheet for Patients:   BoilerBrush.com.cyhttps://www.fda.gov/media/136312/download   Fact Sheet for Healthcare Providers:   https://pope.com/https://www.fda.gov/media/136313/download    This test is not yet approved or cleared by the [place] FDA and  has been authorized for detection and/or diagnosis of SARS-CoV-2 by FDA under an Emergency Use Authorization (EUA).  This EUA will remain in effect (meaning this test can  be used) for the duration of  the COVID-19 declaration under Section 564(b)(1) of the Act, 21 U.S.C. section 360-bbb-3(b)(1), unless the authorization is terminated or revoked sooner.  Performed at [facility], [address] [place], [place] [address]      Labs: Basic Metabolic Panel: Recent Labs  Lab 04/20/20 0529 04/21/20 0801 04/22/20 0502 04/23/20 0545 04/25/20 0603  NA 135 137 136 136 135  K 4.3 4.4 5.3* 4.6 4.4  CL 99 102 105 102 102  CO2 26 26 22 26 25   GLUCOSE 164* 156* 147* 154* 83  BUN 24* 21* 21* 19 20  CREATININE 0.83 0.78 0.73 0.77 0.79  CALCIUM 8.4* 8.7* 8.5* 8.4* 8.6*  MG 2.6* 2.5* 2.6* 2.5*  --   PHOS 4.2 4.0 4.4 3.9  --    Liver Function  Tests: Recent Labs  Lab 04/20/20 0529 04/21/20 0801 04/22/20 0502 04/23/20 0545  AST 53* 48* 66* 45*  ALT 96* 98* 125* 122*  ALKPHOS 36* 36* 39 35*  BILITOT 1.1 0.8 1.7* 1.0  PROT 6.6 6.6 6.1* 6.2*  ALBUMIN 2.4* 2.6* 2.4* 2.5*   CBC: Recent Labs  Lab 04/20/20 0529 04/21/20 0801 04/22/20 0502 04/23/20 0545  WBC 14.1* 15.0* 14.6* 13.9*  NEUTROABS 12.1* 12.4* 12.0* 11.0*  HGB 13.1 13.8 13.1 13.2  HCT 39.6 42.2 40.7 40.0  MCV 91.5 90.9 92.7 91.1  PLT 350 480* 319 547*   CBG: Recent Labs  Lab 04/25/20 0732 04/25/20 1127 04/25/20 1657 04/25/20 2104 04/26/20 0802  GLUCAP 74 161* 168* 139* 89   Hgb A1c No results for input(s): HGBA1C in the last 72 hours. Lipid Profile No results for input(s): CHOL, HDL, LDLCALC, TRIG, CHOLHDL, LDLDIRECT in the last 72 hours. Thyroid function studies No results for input(s): TSH, T4TOTAL, T3FREE, THYROIDAB in the last 72 hours.  Invalid input(s): FREET3 Urinalysis    Component Value Date/Time   COLORURINE YELLOW 03/07/2017 1255   APPEARANCEUR CLOUDY (A) 03/07/2017 1255   LABSPEC 1.017 03/07/2017 1255   PHURINE 7.0 03/07/2017 1255   GLUCOSEU NEGATIVE 03/07/2017 1255   HGBUR NEGATIVE 03/07/2017 1255   BILIRUBINUR NEGATIVE 03/07/2017 1255   KETONESUR NEGATIVE 03/07/2017 1255   PROTEINUR NEGATIVE 03/07/2017 1255   NITRITE NEGATIVE 03/07/2017 1255   LEUKOCYTESUR NEGATIVE 03/07/2017 1255    FURTHER DISCHARGE INSTRUCTIONS:   Get Medicines reviewed and adjusted: Please take all your medications with you for your next visit with your Primary MD   Laboratory/radiological data: Please request your Primary MD to go over all hospital tests and procedure/radiological results at the follow up, please ask your Primary MD to get all Hospital records sent to his/her office.   In some cases, they will be blood work, cultures and biopsy results pending at the time of your discharge. Please request that your primary care M.D. goes through all  the records of your hospital data and follows up on these results.   Also Note the following: If you experience worsening of your admission symptoms, develop shortness of breath, life threatening emergency, suicidal or homicidal thoughts you must seek medical attention immediately by calling 911 or calling  your MD immediately  if symptoms less severe.   You must read complete instructions/literature along with all the possible adverse reactions/side effects for all the Medicines you take and that have been prescribed to you. Take any new Medicines after you have completely understood and accpet all the possible adverse reactions/side effects.    Do not drive when taking Pain medications or sleeping medications (Benzodaizepines)   Do not take more than prescribed Pain, Sleep and Anxiety Medications. It is not advisable to combine anxiety,sleep and pain medications without talking with your primary care practitioner   Special Instructions: If you have smoked or chewed Tobacco  in the last 2 yrs please stop smoking, stop any regular Alcohol  and or any Recreational drug use.   Wear Seat belts while driving.   Please note: You were cared for by a hospitalist during your hospital stay. Once you are discharged, your primary care physician will handle any further medical issues. Please note that NO REFILLS for any discharge medications will be authorized once you are discharged, as it is imperative that you return to your primary care physician (or establish a relationship with a primary care physician if you do not have one) for your post hospital discharge needs so that they can reassess your need for medications and monitor your lab values.  Time coordinating discharge: 35 minutes  SIGNED:  Pamella Pert, MD, PhD 04/26/2020, 9:23 AM

## 2020-05-06 ENCOUNTER — Ambulatory Visit
Admission: RE | Admit: 2020-05-06 | Discharge: 2020-05-06 | Disposition: A | Discharge status: Home / Self Care | Payer: BC Managed Care – PPO | Source: Ambulatory Visit | Attending: Nurse Practitioner | Admitting: Nurse Practitioner

## 2020-05-06 ENCOUNTER — Ambulatory Visit (INDEPENDENT_AMBULATORY_CARE_PROVIDER_SITE_OTHER): Payer: BC Managed Care – PPO | Admitting: Nurse Practitioner

## 2020-05-06 ENCOUNTER — Other Ambulatory Visit: Payer: Self-pay

## 2020-05-06 DIAGNOSIS — U071 COVID-19: Secondary | ICD-10-CM | POA: Diagnosis not present

## 2020-05-06 DIAGNOSIS — R0602 Shortness of breath: Secondary | ICD-10-CM

## 2020-05-06 DIAGNOSIS — R601 Generalized edema: Secondary | ICD-10-CM

## 2020-05-06 DIAGNOSIS — J1282 Pneumonia due to coronavirus disease 2019: Secondary | ICD-10-CM

## 2020-05-06 MED ORDER — ALBUTEROL SULFATE HFA 108 (90 BASE) MCG/ACT IN AERS: 2.0000 | INHALATION_SPRAY | Freq: Four times a day (QID) | RESPIRATORY_TRACT | 1 refills | Status: AC | PRN

## 2020-05-06 NOTE — Patient Instructions (Addendum)
Covid 19 Cough Edema:  Will place referral to cardiology - will check BNP - further evaluation after Covid for possible CHF - per patient request  Stay well hydrated  Stay active  Deep breathing exercises  May start vitamin C 2,000 mg daily, vitamin D3 2,000 IU daily, Zinc 220 mg daily, and Quercetin 500 mg twice daily  May take tylenol or fever or pain  May take mucinex DM twice daily  Will order chest x ray  Will check labs  Low sodium diet   Follow up:  Follow up in 1 month or sooner if needed - will need follow up x ray

## 2020-05-06 NOTE — Progress Notes (Signed)
@Patient  ID: , male    DOB: [birth year], 59 y.o.   MRN: [id number]  Chief Complaint  Patient presents with  . Hospitalization Follow-up    COVID Pos 9/9 Hosp: 9/9-9/17 Sx: Breathing issues,     Referring provider: No ref. provider found  59 year old male with no significant health history. Diagnosed with Covid 04/18/20.   HPI  Patient presents today for post COVID care clinic visit/hospital follow-up.  He was admitted to the hospital on 04/18/2020 and discharged on 04/26/2020.  He was diagnosed with covid pneumonia and acute hypoxic respiratory failure.  Patient was started on remdesivir and barcitinib, but unfortunately developed an allergic reaction.  These medications were discontinued.  Patient was then treated with O2 and steroids.  Patient states that he has been doing well since hospital discharge.  He has been active he does deep breathing exercises.  He was discharged home with oxygen but states that he has not used it.  His O2 sats are stable in the office today.  Patient is concerned about having some edema.  Today in the office he does not have edema but states that he has over the past week and did take one of his wife's Lasix tablets.  He states that this did help relieve the edema and associated shortness of breath.  We discussed that we will check a chest x-ray and lab work including BNP during the visit today.  We discussed that he does need to follow a low-sodium diet.Denies f/c/s, n/v/d, hemoptysis, PND, chest pain or edema.       No Known Allergies   There is no immunization history on file for this patient.  History reviewed. No pertinent past medical history.  Tobacco History: Social History   Tobacco Use  Smoking Status Never Smoker  Smokeless Tobacco Never Used   Counseling given: Not Answered   Outpatient Encounter Medications as of 05/06/2020  Medication Sig  . albuterol (VENTOLIN HFA) 108 (90 Base) MCG/ACT inhaler Inhale 2 puffs into the lungs  every 6 (six) hours as needed for shortness of breath.  . [DISCONTINUED] albuterol (VENTOLIN HFA) 108 (90 Base) MCG/ACT inhaler Inhale 2 puffs into the lungs every 6 (six) hours as needed for shortness of breath.    No facility-administered encounter medications on file as of 05/06/2020.     Review of Systems  Review of Systems  Constitutional: Negative.  Negative for fever.  HENT: Negative.   Respiratory: Positive for cough and shortness of breath (with exertion).   Cardiovascular: Negative.  Negative for chest pain, palpitations and leg swelling.  Gastrointestinal: Negative.   Allergic/Immunologic: Negative.   Neurological: Negative.   Psychiatric/Behavioral: Negative.        Physical Exam  There were no vitals taken for this visit.  Wt Readings from Last 5 Encounters:  04/20/20 171 lb 1.2 oz (77.6 kg)  03/07/17 182 lb (82.6 kg)     Physical Exam Vitals and nursing note reviewed.  Constitutional:      General: He is not in acute distress.    Appearance: He is well-developed.  Cardiovascular:     Rate and Rhythm: Normal rate and regular rhythm.  Pulmonary:     Effort: Pulmonary effort is normal.     Breath sounds: Normal breath sounds.  Musculoskeletal:     Right lower leg: No edema.     Left lower leg: No edema.  Skin:    General: Skin is warm and dry.  Neurological:  Mental Status: He is alert and oriented to person, place, and time.       Imaging: DG Chest 2 View  Result Date: 05/06/2020 CLINICAL DATA:  COVID pneumonia. EXAM: CHEST - 2 VIEW COMPARISON:  04/18/2020 FINDINGS: Multifocal bilateral peripheral prominent airspace opacities. Cardiac silhouette is upper limits of normal. No pleural effusions. No pneumothorax. No acute osseous abnormality. IMPRESSION: Multifocal bilateral peripheral prominent airspace opacities, compatible with COVID pneumonia. Electronically Signed   By: Feliberto Harts MD   On: 05/06/2020 15:29   DG Chest Port 1  View  Result Date: 04/18/2020 CLINICAL DATA:  COVID-19 positive 12 days ago, cough, short of breath EXAM: PORTABLE CHEST 1 VIEW COMPARISON:  None. FINDINGS: Single frontal view of the chest demonstrates an unremarkable cardiac silhouette. There is multifocal bilateral ground-glass airspace disease greatest in the lung bases. No effusion or pneumothorax. No acute bony abnormalities. IMPRESSION: 1. Multifocal bilateral pneumonia compatible with COVID 19. Electronically Signed   By: Sharlet Salina M.D.   On: 04/18/2020 21:11     Assessment & Plan:   Pneumonia due to COVID-19 virus Covid 19 Cough Edema:  Will place referral to cardiology - will check BNP - further evaluation after Covid for possible CHF - per patient request  Stay well hydrated  Stay active  Deep breathing exercises  May start vitamin C 2,000 mg daily, vitamin D3 2,000 IU daily, Zinc 220 mg daily, and Quercetin 500 mg twice daily  May take tylenol or fever or pain  May take mucinex DM twice daily  Will order chest x ray  Will check labs  Low sodium diet   Follow up:  Follow up in 1 month or sooner if needed       Ivonne Andrew, NP 05/07/2020

## 2020-05-07 LAB — CBC
Hematocrit: 40.7 % (ref 37.5–51.0)
Hemoglobin: 13.9 g/dL (ref 13.0–17.7)
MCH: 31.2 pg (ref 26.6–33.0)
MCHC: 34.2 g/dL (ref 31.5–35.7)
MCV: 92 fL (ref 79–97)
Platelets: 169 10*3/uL (ref 150–450)
RBC: 4.45 x10E6/uL (ref 4.14–5.80)
RDW: 13.6 % (ref 11.6–15.4)
WBC: 6.1 10*3/uL (ref 3.4–10.8)

## 2020-05-07 LAB — COMPREHENSIVE METABOLIC PANEL
ALT: 58 IU/L — ABNORMAL HIGH (ref 0–44)
AST: 26 IU/L (ref 0–40)
Albumin/Globulin Ratio: 1.3 (ref 1.2–2.2)
Albumin: 3.9 g/dL (ref 3.8–4.9)
Alkaline Phosphatase: 62 IU/L (ref 44–121)
BUN/Creatinine Ratio: 18 (ref 9–20)
BUN: 14 mg/dL (ref 6–24)
Bilirubin Total: 0.4 mg/dL (ref 0.0–1.2)
CO2: 25 mmol/L (ref 20–29)
Calcium: 8.9 mg/dL (ref 8.7–10.2)
Chloride: 100 mmol/L (ref 96–106)
Creatinine, Ser: 0.8 mg/dL (ref 0.76–1.27)
GFR calc Af Amer: 113 mL/min/{1.73_m2} (ref >59)
GFR calc non Af Amer: 98 mL/min/{1.73_m2} (ref >59)
Globulin, Total: 2.9 g/dL (ref 1.5–4.5)
Glucose: 135 mg/dL — ABNORMAL HIGH (ref 65–99)
Potassium: 4.3 mmol/L (ref 3.5–5.2)
Sodium: 137 mmol/L (ref 134–144)
Total Protein: 6.8 g/dL (ref 6.0–8.5)

## 2020-05-07 LAB — BRAIN NATRIURETIC PEPTIDE: BNP: 43.6 pg/mL (ref 0.0–100.0)

## 2020-05-07 NOTE — Assessment & Plan Note (Signed)
Covid 19 Cough Edema:  Will place referral to cardiology - will check BNP - further evaluation after Covid for possible CHF - per patient request  Stay well hydrated  Stay active  Deep breathing exercises  May start vitamin C 2,000 mg daily, vitamin D3 2,000 IU daily, Zinc 220 mg daily, and Quercetin 500 mg twice daily  May take tylenol or fever or pain  May take mucinex DM twice daily  Will order chest x ray  Will check labs  Low sodium diet   Follow up:  Follow up in 1 month or sooner if needed

## 2020-05-26 DIAGNOSIS — U071 COVID-19: Secondary | ICD-10-CM | POA: Diagnosis not present

## 2020-05-26 DIAGNOSIS — R739 Hyperglycemia, unspecified: Secondary | ICD-10-CM | POA: Diagnosis not present

## 2020-05-26 DIAGNOSIS — J1282 Pneumonia due to coronavirus disease 2019: Secondary | ICD-10-CM | POA: Diagnosis not present

## 2020-05-26 DIAGNOSIS — J9601 Acute respiratory failure with hypoxia: Secondary | ICD-10-CM | POA: Diagnosis not present

## 2020-05-27 ENCOUNTER — Ambulatory Visit (INDEPENDENT_AMBULATORY_CARE_PROVIDER_SITE_OTHER): Payer: BC Managed Care – PPO | Admitting: Nurse Practitioner

## 2020-05-27 DIAGNOSIS — J1282 Pneumonia due to coronavirus disease 2019: Secondary | ICD-10-CM

## 2020-05-27 DIAGNOSIS — U071 COVID-19: Secondary | ICD-10-CM | POA: Diagnosis not present

## 2020-05-27 NOTE — Patient Instructions (Signed)
Covid 19 Cough:   Stay well hydrated  Stay active  Deep breathing exercises  May take tylenol or fever or pain  May take mucinex DM twice daily if needed     Follow up:  Follow up in 1 month or sooner if needed - will need repeat chest xray

## 2020-05-27 NOTE — Progress Notes (Signed)
@Patient  ID: , male    DOB: 10-18-1960, 59 y.o.   MRN: [id number]  Chief Complaint  Patient presents with  . Follow-up    Feeling alot better. May need return to work note.     Referring provider: No ref. provider found    59 year old male with no significant health history. Diagnosed with Covid 04/18/20.  HPI   Patient presents today for post COVID care clinic visit follow-up.  Patient states that he is much improved since his last visit here.  He has been staying active.  He walks 6 miles per day.  He states that he does check his O2 sats while walking and at times his O2 sats will drop but he recovers very quickly with rest.  He has improved on using his incentive spirometer.  He does want to return back to work today. Denies f/c/s, n/v/d, hemoptysis, PND, chest pain or edema.     No Known Allergies   There is no immunization history on file for this patient.  No past medical history on file.  Tobacco History: Social History   Tobacco Use  Smoking Status Never Smoker  Smokeless Tobacco Never Used   Counseling given: Not Answered   Outpatient Encounter Medications as of 05/27/2020  Medication Sig  . albuterol (VENTOLIN HFA) 108 (90 Base) MCG/ACT inhaler Inhale 2 puffs into the lungs every 6 (six) hours as needed for shortness of breath.   No facility-administered encounter medications on file as of 05/27/2020.     Review of Systems  Review of Systems  Constitutional: Negative.  Negative for fatigue and fever.  HENT: Negative.   Respiratory: Negative for cough and shortness of breath.   Cardiovascular: Negative.  Negative for chest pain, palpitations and leg swelling.  Gastrointestinal: Negative.   Allergic/Immunologic: Negative.   Neurological: Negative.   Psychiatric/Behavioral: Negative.        Physical Exam  BP (!) 142/88 (BP Location: Left Arm)   Pulse 65   Temp (!) 97.1 F (36.2 C)   Wt 168 lb (76.2 kg)   SpO2 98%   BMI 29.76 kg/m    Wt Readings from Last 5 Encounters:  05/27/20 168 lb (76.2 kg)  04/20/20 171 lb 1.2 oz (77.6 kg)  03/07/17 182 lb (82.6 kg)     Physical Exam Vitals and nursing note reviewed.  Constitutional:      General: He is not in acute distress.    Appearance: He is well-developed.  Cardiovascular:     Rate and Rhythm: Normal rate and regular rhythm.  Pulmonary:     Effort: Pulmonary effort is normal.     Breath sounds: Normal breath sounds.  Skin:    General: Skin is warm and dry.  Neurological:     Mental Status: He is alert and oriented to person, place, and time.  Psychiatric:        Mood and Affect: Mood normal.        Behavior: Behavior normal.       Imaging: DG Chest 2 View  Result Date: 05/06/2020 CLINICAL DATA:  COVID pneumonia. EXAM: CHEST - 2 VIEW COMPARISON:  04/18/2020 FINDINGS: Multifocal bilateral peripheral prominent airspace opacities. Cardiac silhouette is upper limits of normal. No pleural effusions. No pneumothorax. No acute osseous abnormality. IMPRESSION: Multifocal bilateral peripheral prominent airspace opacities, compatible with COVID pneumonia. Electronically Signed   By: 06/18/2020 MD   On: 05/06/2020 15:29     Assessment & Plan:   Pneumonia due  to COVID-19 virus Cough:   Stay well hydrated  Stay active  Deep breathing exercises  May take tylenol or fever or pain  May take mucinex DM twice daily if needed     Follow up:  Follow up in 1 month or sooner if needed - will need repeat chest xray      Ivonne Andrew, NP 05/27/2020

## 2020-05-27 NOTE — Assessment & Plan Note (Signed)
Cough:   Stay well hydrated  Stay active  Deep breathing exercises  May take tylenol or fever or pain  May take mucinex DM twice daily if needed     Follow up:  Follow up in 1 month or sooner if needed - will need repeat chest xray

## 2020-06-14 ENCOUNTER — Ambulatory Visit: Payer: BC Managed Care – PPO | Admitting: Cardiovascular Disease

## 2020-06-26 DIAGNOSIS — R739 Hyperglycemia, unspecified: Secondary | ICD-10-CM | POA: Diagnosis not present

## 2020-06-26 DIAGNOSIS — J9601 Acute respiratory failure with hypoxia: Secondary | ICD-10-CM | POA: Diagnosis not present

## 2020-06-26 DIAGNOSIS — U071 COVID-19: Secondary | ICD-10-CM | POA: Diagnosis not present

## 2020-06-26 DIAGNOSIS — J1282 Pneumonia due to coronavirus disease 2019: Secondary | ICD-10-CM | POA: Diagnosis not present

## 2020-07-02 ENCOUNTER — Ambulatory Visit: Payer: BC Managed Care – PPO

## 2020-07-08 ENCOUNTER — Encounter: Payer: Self-pay | Admitting: General Practice

## 2020-07-26 DIAGNOSIS — R739 Hyperglycemia, unspecified: Secondary | ICD-10-CM | POA: Diagnosis not present

## 2020-07-26 DIAGNOSIS — J1282 Pneumonia due to coronavirus disease 2019: Secondary | ICD-10-CM | POA: Diagnosis not present

## 2020-07-26 DIAGNOSIS — U071 COVID-19: Secondary | ICD-10-CM | POA: Diagnosis not present

## 2020-07-26 DIAGNOSIS — J9601 Acute respiratory failure with hypoxia: Secondary | ICD-10-CM | POA: Diagnosis not present

## 2020-08-26 DIAGNOSIS — J9601 Acute respiratory failure with hypoxia: Secondary | ICD-10-CM | POA: Diagnosis not present

## 2020-08-26 DIAGNOSIS — R739 Hyperglycemia, unspecified: Secondary | ICD-10-CM | POA: Diagnosis not present

## 2020-08-26 DIAGNOSIS — U071 COVID-19: Secondary | ICD-10-CM | POA: Diagnosis not present

## 2020-08-26 DIAGNOSIS — J1282 Pneumonia due to coronavirus disease 2019: Secondary | ICD-10-CM | POA: Diagnosis not present

## 2020-09-26 DIAGNOSIS — J9601 Acute respiratory failure with hypoxia: Secondary | ICD-10-CM | POA: Diagnosis not present

## 2020-09-26 DIAGNOSIS — J1282 Pneumonia due to coronavirus disease 2019: Secondary | ICD-10-CM | POA: Diagnosis not present

## 2020-09-26 DIAGNOSIS — U071 COVID-19: Secondary | ICD-10-CM | POA: Diagnosis not present

## 2020-09-26 DIAGNOSIS — R739 Hyperglycemia, unspecified: Secondary | ICD-10-CM | POA: Diagnosis not present

## 2020-10-24 DIAGNOSIS — J1282 Pneumonia due to coronavirus disease 2019: Secondary | ICD-10-CM | POA: Diagnosis not present

## 2020-10-24 DIAGNOSIS — J9601 Acute respiratory failure with hypoxia: Secondary | ICD-10-CM | POA: Diagnosis not present

## 2020-10-24 DIAGNOSIS — U071 COVID-19: Secondary | ICD-10-CM | POA: Diagnosis not present

## 2020-10-24 DIAGNOSIS — R739 Hyperglycemia, unspecified: Secondary | ICD-10-CM | POA: Diagnosis not present

## 2020-11-24 DIAGNOSIS — J9601 Acute respiratory failure with hypoxia: Secondary | ICD-10-CM | POA: Diagnosis not present

## 2020-11-24 DIAGNOSIS — U071 COVID-19: Secondary | ICD-10-CM | POA: Diagnosis not present

## 2020-11-24 DIAGNOSIS — J1282 Pneumonia due to coronavirus disease 2019: Secondary | ICD-10-CM | POA: Diagnosis not present

## 2020-11-24 DIAGNOSIS — R739 Hyperglycemia, unspecified: Secondary | ICD-10-CM | POA: Diagnosis not present

## 2020-12-24 DIAGNOSIS — J9601 Acute respiratory failure with hypoxia: Secondary | ICD-10-CM | POA: Diagnosis not present

## 2020-12-24 DIAGNOSIS — R739 Hyperglycemia, unspecified: Secondary | ICD-10-CM | POA: Diagnosis not present

## 2020-12-24 DIAGNOSIS — U071 COVID-19: Secondary | ICD-10-CM | POA: Diagnosis not present

## 2020-12-24 DIAGNOSIS — J1282 Pneumonia due to coronavirus disease 2019: Secondary | ICD-10-CM | POA: Diagnosis not present

## 2021-01-24 DIAGNOSIS — R739 Hyperglycemia, unspecified: Secondary | ICD-10-CM | POA: Diagnosis not present

## 2021-01-24 DIAGNOSIS — J9601 Acute respiratory failure with hypoxia: Secondary | ICD-10-CM | POA: Diagnosis not present

## 2021-01-24 DIAGNOSIS — J1282 Pneumonia due to coronavirus disease 2019: Secondary | ICD-10-CM | POA: Diagnosis not present

## 2021-01-24 DIAGNOSIS — U071 COVID-19: Secondary | ICD-10-CM | POA: Diagnosis not present

## 2021-02-08 IMAGING — DX DG CHEST 1V PORT
1 series · 1 of 1 positions shown · non-contrast
Comparison: None.

CLINICAL DATA: [id number] positive 12 days ago, cough, short of
breath

EXAM:
PORTABLE CHEST 1 VIEW

[chest ap]
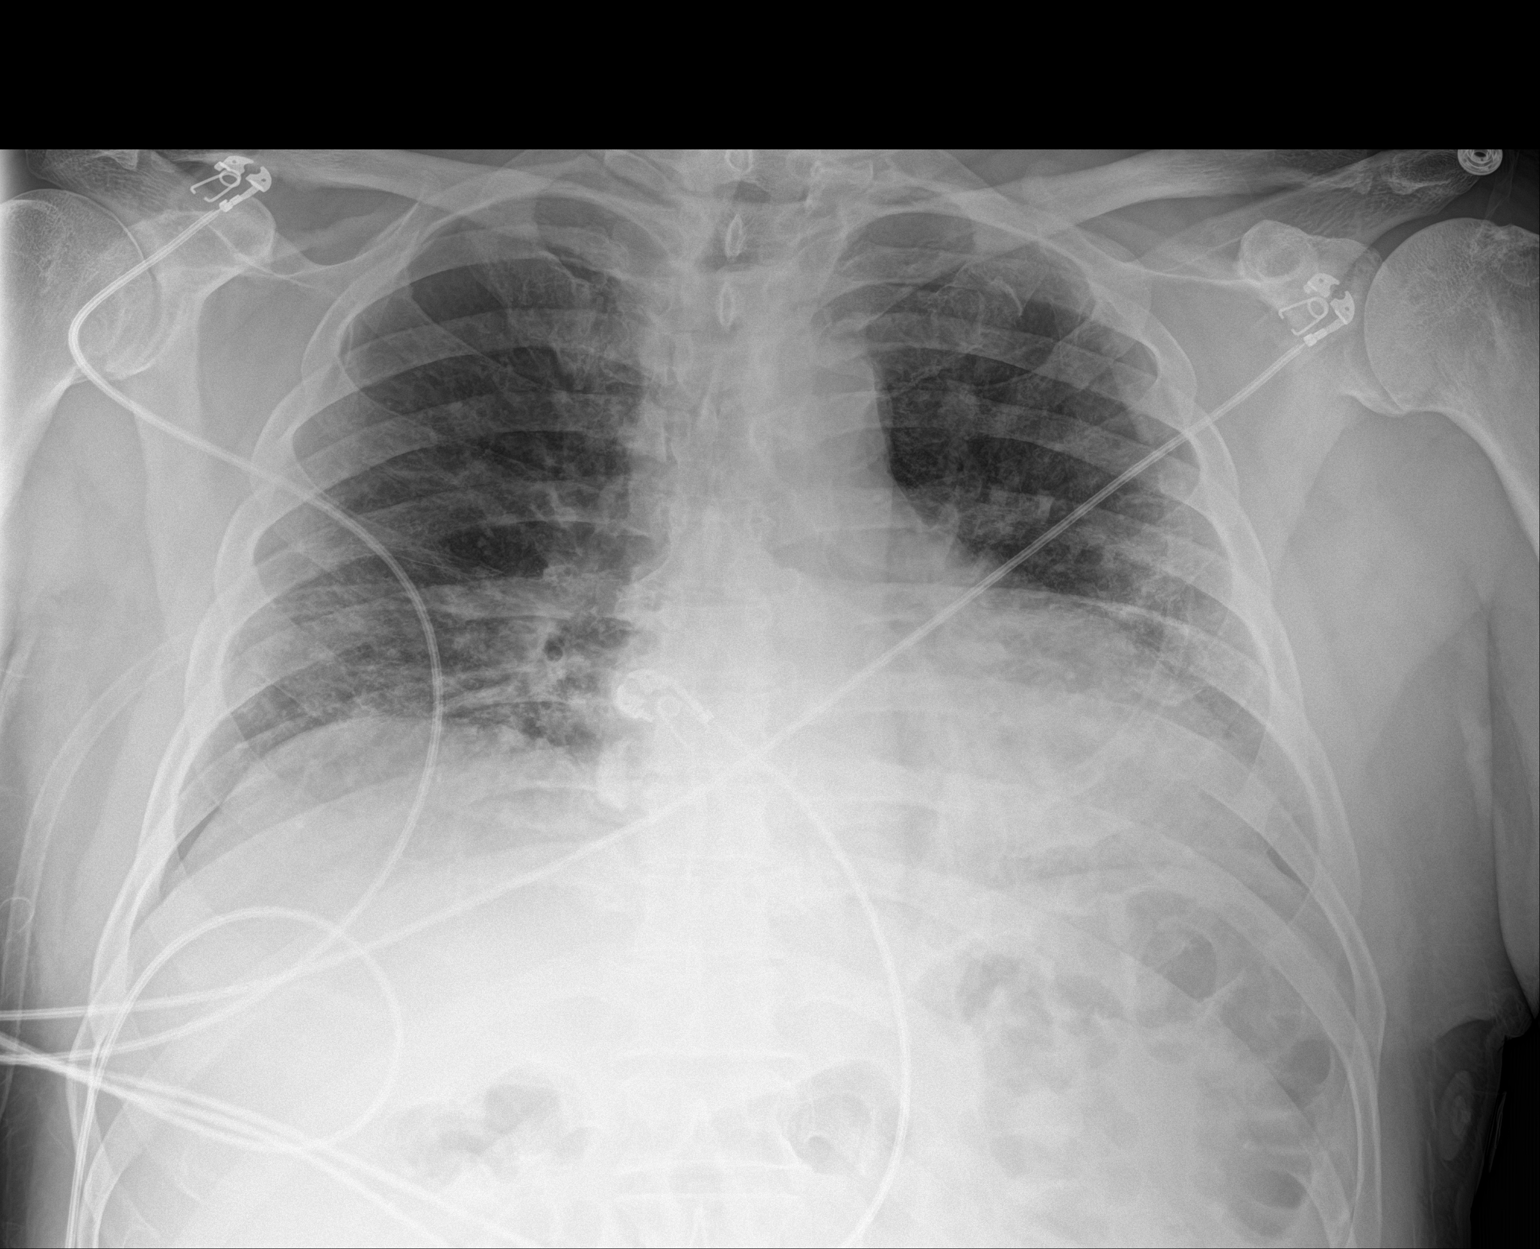

[1 of 1 positions shown; findings below may reference images not displayed]

FINDINGS: Single frontal view of the chest demonstrates an unremarkable
cardiac silhouette. There is multifocal bilateral ground-glass
airspace disease greatest in the lung bases. No effusion or
pneumothorax. No acute bony abnormalities.
IMPRESSION: 1. Multifocal bilateral pneumonia compatible with COVID 19.

## 2021-02-23 DIAGNOSIS — J9601 Acute respiratory failure with hypoxia: Secondary | ICD-10-CM | POA: Diagnosis not present

## 2021-02-23 DIAGNOSIS — U071 COVID-19: Secondary | ICD-10-CM | POA: Diagnosis not present

## 2021-02-23 DIAGNOSIS — R739 Hyperglycemia, unspecified: Secondary | ICD-10-CM | POA: Diagnosis not present

## 2021-02-23 DIAGNOSIS — J1282 Pneumonia due to coronavirus disease 2019: Secondary | ICD-10-CM | POA: Diagnosis not present

## 2021-03-26 DIAGNOSIS — J1282 Pneumonia due to coronavirus disease 2019: Secondary | ICD-10-CM | POA: Diagnosis not present

## 2021-03-26 DIAGNOSIS — R739 Hyperglycemia, unspecified: Secondary | ICD-10-CM | POA: Diagnosis not present

## 2021-03-26 DIAGNOSIS — U071 COVID-19: Secondary | ICD-10-CM | POA: Diagnosis not present

## 2021-03-26 DIAGNOSIS — J9601 Acute respiratory failure with hypoxia: Secondary | ICD-10-CM | POA: Diagnosis not present

## 2021-04-26 DIAGNOSIS — J1282 Pneumonia due to coronavirus disease 2019: Secondary | ICD-10-CM | POA: Diagnosis not present

## 2021-04-26 DIAGNOSIS — R739 Hyperglycemia, unspecified: Secondary | ICD-10-CM | POA: Diagnosis not present

## 2021-04-26 DIAGNOSIS — J9601 Acute respiratory failure with hypoxia: Secondary | ICD-10-CM | POA: Diagnosis not present

## 2021-04-26 DIAGNOSIS — U071 COVID-19: Secondary | ICD-10-CM | POA: Diagnosis not present

## 2021-05-26 DIAGNOSIS — J1282 Pneumonia due to coronavirus disease 2019: Secondary | ICD-10-CM | POA: Diagnosis not present

## 2021-05-26 DIAGNOSIS — U071 COVID-19: Secondary | ICD-10-CM | POA: Diagnosis not present

## 2021-05-26 DIAGNOSIS — R739 Hyperglycemia, unspecified: Secondary | ICD-10-CM | POA: Diagnosis not present

## 2021-05-26 DIAGNOSIS — J9601 Acute respiratory failure with hypoxia: Secondary | ICD-10-CM | POA: Diagnosis not present

## 2021-06-26 DIAGNOSIS — R739 Hyperglycemia, unspecified: Secondary | ICD-10-CM | POA: Diagnosis not present

## 2021-06-26 DIAGNOSIS — J9601 Acute respiratory failure with hypoxia: Secondary | ICD-10-CM | POA: Diagnosis not present

## 2021-06-26 DIAGNOSIS — J1282 Pneumonia due to coronavirus disease 2019: Secondary | ICD-10-CM | POA: Diagnosis not present

## 2021-06-26 DIAGNOSIS — U071 COVID-19: Secondary | ICD-10-CM | POA: Diagnosis not present

## 2021-07-26 DIAGNOSIS — J9601 Acute respiratory failure with hypoxia: Secondary | ICD-10-CM | POA: Diagnosis not present

## 2021-07-26 DIAGNOSIS — U071 COVID-19: Secondary | ICD-10-CM | POA: Diagnosis not present

## 2021-07-26 DIAGNOSIS — R739 Hyperglycemia, unspecified: Secondary | ICD-10-CM | POA: Diagnosis not present

## 2021-07-26 DIAGNOSIS — J1282 Pneumonia due to coronavirus disease 2019: Secondary | ICD-10-CM | POA: Diagnosis not present

## 2021-08-26 DIAGNOSIS — R739 Hyperglycemia, unspecified: Secondary | ICD-10-CM | POA: Diagnosis not present

## 2021-08-26 DIAGNOSIS — J9601 Acute respiratory failure with hypoxia: Secondary | ICD-10-CM | POA: Diagnosis not present

## 2021-08-26 DIAGNOSIS — J1282 Pneumonia due to coronavirus disease 2019: Secondary | ICD-10-CM | POA: Diagnosis not present

## 2021-08-26 DIAGNOSIS — U071 COVID-19: Secondary | ICD-10-CM | POA: Diagnosis not present

## 2021-09-26 DIAGNOSIS — U071 COVID-19: Secondary | ICD-10-CM | POA: Diagnosis not present

## 2021-09-26 DIAGNOSIS — J9601 Acute respiratory failure with hypoxia: Secondary | ICD-10-CM | POA: Diagnosis not present

## 2021-09-26 DIAGNOSIS — J1282 Pneumonia due to coronavirus disease 2019: Secondary | ICD-10-CM | POA: Diagnosis not present

## 2021-09-26 DIAGNOSIS — R739 Hyperglycemia, unspecified: Secondary | ICD-10-CM | POA: Diagnosis not present

## 2021-10-24 DIAGNOSIS — R739 Hyperglycemia, unspecified: Secondary | ICD-10-CM | POA: Diagnosis not present

## 2021-10-24 DIAGNOSIS — J9601 Acute respiratory failure with hypoxia: Secondary | ICD-10-CM | POA: Diagnosis not present

## 2021-10-24 DIAGNOSIS — U071 COVID-19: Secondary | ICD-10-CM | POA: Diagnosis not present

## 2021-10-24 DIAGNOSIS — J1282 Pneumonia due to coronavirus disease 2019: Secondary | ICD-10-CM | POA: Diagnosis not present

## 2021-11-24 DIAGNOSIS — J1282 Pneumonia due to coronavirus disease 2019: Secondary | ICD-10-CM | POA: Diagnosis not present

## 2021-11-24 DIAGNOSIS — R739 Hyperglycemia, unspecified: Secondary | ICD-10-CM | POA: Diagnosis not present

## 2021-11-24 DIAGNOSIS — J9601 Acute respiratory failure with hypoxia: Secondary | ICD-10-CM | POA: Diagnosis not present

## 2021-11-24 DIAGNOSIS — U071 COVID-19: Secondary | ICD-10-CM | POA: Diagnosis not present

## 2021-12-24 DIAGNOSIS — R739 Hyperglycemia, unspecified: Secondary | ICD-10-CM | POA: Diagnosis not present

## 2021-12-24 DIAGNOSIS — U071 COVID-19: Secondary | ICD-10-CM | POA: Diagnosis not present

## 2021-12-24 DIAGNOSIS — J9601 Acute respiratory failure with hypoxia: Secondary | ICD-10-CM | POA: Diagnosis not present

## 2021-12-24 DIAGNOSIS — J1282 Pneumonia due to coronavirus disease 2019: Secondary | ICD-10-CM | POA: Diagnosis not present

## 2022-01-24 DIAGNOSIS — J1282 Pneumonia due to coronavirus disease 2019: Secondary | ICD-10-CM | POA: Diagnosis not present

## 2022-01-24 DIAGNOSIS — U071 COVID-19: Secondary | ICD-10-CM | POA: Diagnosis not present

## 2022-01-24 DIAGNOSIS — R739 Hyperglycemia, unspecified: Secondary | ICD-10-CM | POA: Diagnosis not present

## 2022-01-24 DIAGNOSIS — J9601 Acute respiratory failure with hypoxia: Secondary | ICD-10-CM | POA: Diagnosis not present

## 2022-02-23 DIAGNOSIS — R739 Hyperglycemia, unspecified: Secondary | ICD-10-CM | POA: Diagnosis not present

## 2022-02-23 DIAGNOSIS — J1282 Pneumonia due to coronavirus disease 2019: Secondary | ICD-10-CM | POA: Diagnosis not present

## 2022-02-23 DIAGNOSIS — J9601 Acute respiratory failure with hypoxia: Secondary | ICD-10-CM | POA: Diagnosis not present

## 2022-02-23 DIAGNOSIS — U071 COVID-19: Secondary | ICD-10-CM | POA: Diagnosis not present

## 2022-03-26 DIAGNOSIS — U071 COVID-19: Secondary | ICD-10-CM | POA: Diagnosis not present

## 2022-03-26 DIAGNOSIS — J9601 Acute respiratory failure with hypoxia: Secondary | ICD-10-CM | POA: Diagnosis not present

## 2022-03-26 DIAGNOSIS — R739 Hyperglycemia, unspecified: Secondary | ICD-10-CM | POA: Diagnosis not present

## 2022-03-26 DIAGNOSIS — J1282 Pneumonia due to coronavirus disease 2019: Secondary | ICD-10-CM | POA: Diagnosis not present

## 2022-04-26 DIAGNOSIS — J9601 Acute respiratory failure with hypoxia: Secondary | ICD-10-CM | POA: Diagnosis not present

## 2022-04-26 DIAGNOSIS — J1282 Pneumonia due to coronavirus disease 2019: Secondary | ICD-10-CM | POA: Diagnosis not present

## 2022-04-26 DIAGNOSIS — U071 COVID-19: Secondary | ICD-10-CM | POA: Diagnosis not present

## 2022-04-26 DIAGNOSIS — R739 Hyperglycemia, unspecified: Secondary | ICD-10-CM | POA: Diagnosis not present

## 2022-05-26 DIAGNOSIS — R739 Hyperglycemia, unspecified: Secondary | ICD-10-CM | POA: Diagnosis not present

## 2022-05-26 DIAGNOSIS — J9601 Acute respiratory failure with hypoxia: Secondary | ICD-10-CM | POA: Diagnosis not present

## 2022-05-26 DIAGNOSIS — U071 COVID-19: Secondary | ICD-10-CM | POA: Diagnosis not present

## 2022-05-26 DIAGNOSIS — J1282 Pneumonia due to coronavirus disease 2019: Secondary | ICD-10-CM | POA: Diagnosis not present
